# Patient Record
Sex: Female | Born: 1935 | Race: Black or African American | Hispanic: No | Smoking: Former smoker
Health system: Southern US, Community
[De-identification: ages and names within clinical notes are randomized; demographics above are authoritative.]

## PROBLEM LIST (undated history)

## (undated) DIAGNOSIS — M069 Rheumatoid arthritis, unspecified: Secondary | ICD-10-CM

## (undated) DIAGNOSIS — I10 Essential (primary) hypertension: Secondary | ICD-10-CM

## (undated) DIAGNOSIS — I639 Cerebral infarction, unspecified: Secondary | ICD-10-CM

## (undated) DIAGNOSIS — F329 Major depressive disorder, single episode, unspecified: Secondary | ICD-10-CM

## (undated) DIAGNOSIS — I2699 Other pulmonary embolism without acute cor pulmonale: Secondary | ICD-10-CM

## (undated) DIAGNOSIS — E785 Hyperlipidemia, unspecified: Secondary | ICD-10-CM

## (undated) DIAGNOSIS — I38 Endocarditis, valve unspecified: Secondary | ICD-10-CM

## (undated) DIAGNOSIS — R0602 Shortness of breath: Secondary | ICD-10-CM

## (undated) DIAGNOSIS — F32A Depression, unspecified: Secondary | ICD-10-CM

## (undated) DIAGNOSIS — K219 Gastro-esophageal reflux disease without esophagitis: Secondary | ICD-10-CM

## (undated) DIAGNOSIS — R42 Dizziness and giddiness: Secondary | ICD-10-CM

## (undated) HISTORY — DX: Endocarditis, valve unspecified: I38

## (undated) HISTORY — DX: Cerebral infarction, unspecified: I63.9

## (undated) HISTORY — DX: Essential (primary) hypertension: I10

## (undated) HISTORY — PX: WRIST SURGERY: SHX841

## (undated) HISTORY — PX: ABDOMINAL HYSTERECTOMY: SHX81

## (undated) HISTORY — PX: TUBAL LIGATION: SHX77

## (undated) HISTORY — DX: Other pulmonary embolism without acute cor pulmonale: I26.99

## (undated) HISTORY — DX: Rheumatoid arthritis, unspecified: M06.9

## (undated) HISTORY — DX: Hyperlipidemia, unspecified: E78.5

## (undated) HISTORY — PX: TONSILLECTOMY: SUR1361

## (undated) HISTORY — PX: FRACTURE SURGERY: SHX138

## (undated) HISTORY — DX: Gastro-esophageal reflux disease without esophagitis: K21.9

---

## 1969-12-15 HISTORY — PX: VARICOSE VEIN SURGERY: SHX832

## 1984-12-15 HISTORY — PX: ROTATOR CUFF REPAIR: SHX139

## 1996-12-15 HISTORY — PX: RECTOCELE REPAIR: SHX761

## 1998-03-29 ENCOUNTER — Ambulatory Visit (HOSPITAL_BASED_OUTPATIENT_CLINIC_OR_DEPARTMENT_OTHER): Admission: RE | Admit: 1998-03-29 | Discharge: 1998-03-29 | Payer: Self-pay | Admitting: Orthopaedic Surgery

## 1998-04-10 ENCOUNTER — Ambulatory Visit (HOSPITAL_COMMUNITY): Admission: RE | Admit: 1998-04-10 | Discharge: 1998-04-10 | Payer: Self-pay | Admitting: Internal Medicine

## 1998-04-16 ENCOUNTER — Other Ambulatory Visit: Admission: RE | Admit: 1998-04-16 | Discharge: 1998-04-16 | Payer: Self-pay | Admitting: Obstetrics and Gynecology

## 1998-05-01 ENCOUNTER — Ambulatory Visit (HOSPITAL_BASED_OUTPATIENT_CLINIC_OR_DEPARTMENT_OTHER): Admission: RE | Admit: 1998-05-01 | Discharge: 1998-05-01 | Payer: Self-pay | Admitting: Orthopaedic Surgery

## 1999-04-15 ENCOUNTER — Other Ambulatory Visit: Admission: RE | Admit: 1999-04-15 | Discharge: 1999-04-15 | Payer: Self-pay | Admitting: Obstetrics and Gynecology

## 2000-03-30 ENCOUNTER — Encounter: Payer: Self-pay | Admitting: Internal Medicine

## 2000-03-30 ENCOUNTER — Encounter: Admission: RE | Admit: 2000-03-30 | Discharge: 2000-03-30 | Payer: Self-pay | Admitting: Internal Medicine

## 2000-04-26 ENCOUNTER — Emergency Department (HOSPITAL_COMMUNITY): Admission: EM | Admit: 2000-04-26 | Discharge: 2000-04-27 | Payer: Self-pay | Admitting: Emergency Medicine

## 2000-04-26 ENCOUNTER — Encounter: Payer: Self-pay | Admitting: Emergency Medicine

## 2000-06-01 ENCOUNTER — Other Ambulatory Visit: Admission: RE | Admit: 2000-06-01 | Discharge: 2000-06-01 | Payer: Self-pay | Admitting: Obstetrics and Gynecology

## 2001-03-31 ENCOUNTER — Encounter: Payer: Self-pay | Admitting: Internal Medicine

## 2001-03-31 ENCOUNTER — Encounter: Admission: RE | Admit: 2001-03-31 | Discharge: 2001-03-31 | Payer: Self-pay | Admitting: Internal Medicine

## 2001-04-06 ENCOUNTER — Encounter: Admission: RE | Admit: 2001-04-06 | Discharge: 2001-04-06 | Payer: Self-pay | Admitting: Internal Medicine

## 2001-04-06 ENCOUNTER — Encounter: Payer: Self-pay | Admitting: Internal Medicine

## 2001-06-21 ENCOUNTER — Other Ambulatory Visit: Admission: RE | Admit: 2001-06-21 | Discharge: 2001-06-21 | Payer: Self-pay | Admitting: Obstetrics and Gynecology

## 2001-06-24 ENCOUNTER — Encounter: Admission: RE | Admit: 2001-06-24 | Discharge: 2001-06-24 | Payer: Self-pay | Admitting: Rheumatology

## 2001-06-24 ENCOUNTER — Encounter: Payer: Self-pay | Admitting: Rheumatology

## 2002-04-12 ENCOUNTER — Encounter: Admission: RE | Admit: 2002-04-12 | Discharge: 2002-04-12 | Payer: Self-pay | Admitting: Internal Medicine

## 2002-04-12 ENCOUNTER — Encounter: Payer: Self-pay | Admitting: Internal Medicine

## 2002-04-14 HISTORY — PX: PEG TUBE PLACEMENT: SUR1034

## 2002-06-20 ENCOUNTER — Encounter: Admission: RE | Admit: 2002-06-20 | Discharge: 2002-06-20 | Payer: Self-pay | Admitting: Rheumatology

## 2002-06-20 ENCOUNTER — Encounter: Payer: Self-pay | Admitting: Rheumatology

## 2002-07-25 ENCOUNTER — Other Ambulatory Visit: Admission: RE | Admit: 2002-07-25 | Discharge: 2002-07-25 | Payer: Self-pay | Admitting: Obstetrics and Gynecology

## 2003-04-17 ENCOUNTER — Encounter: Payer: Self-pay | Admitting: Internal Medicine

## 2003-04-17 ENCOUNTER — Encounter: Admission: RE | Admit: 2003-04-17 | Discharge: 2003-04-17 | Payer: Self-pay | Admitting: Internal Medicine

## 2003-05-01 ENCOUNTER — Encounter: Payer: Self-pay | Admitting: Rheumatology

## 2003-05-01 ENCOUNTER — Encounter: Admission: RE | Admit: 2003-05-01 | Discharge: 2003-05-01 | Payer: Self-pay | Admitting: Rheumatology

## 2003-09-11 ENCOUNTER — Other Ambulatory Visit: Admission: RE | Admit: 2003-09-11 | Discharge: 2003-09-11 | Payer: Self-pay | Admitting: Obstetrics and Gynecology

## 2004-04-08 ENCOUNTER — Inpatient Hospital Stay (HOSPITAL_COMMUNITY): Admission: EM | Admit: 2004-04-08 | Discharge: 2004-04-23 | Payer: Self-pay | Admitting: Emergency Medicine

## 2004-04-10 ENCOUNTER — Encounter: Payer: Self-pay | Admitting: Cardiovascular Disease

## 2004-04-16 ENCOUNTER — Encounter: Payer: Self-pay | Admitting: Cardiology

## 2004-04-30 ENCOUNTER — Emergency Department (HOSPITAL_COMMUNITY): Admission: EM | Admit: 2004-04-30 | Discharge: 2004-04-30 | Payer: Self-pay | Admitting: *Deleted

## 2004-05-03 ENCOUNTER — Encounter: Payer: Self-pay | Admitting: Cardiology

## 2004-05-03 ENCOUNTER — Ambulatory Visit: Admission: RE | Admit: 2004-05-03 | Discharge: 2004-05-03 | Payer: Self-pay | Admitting: Endocrinology

## 2004-07-04 ENCOUNTER — Emergency Department (HOSPITAL_COMMUNITY): Admission: EM | Admit: 2004-07-04 | Discharge: 2004-07-04 | Payer: Self-pay | Admitting: Emergency Medicine

## 2004-08-12 ENCOUNTER — Ambulatory Visit (HOSPITAL_COMMUNITY): Admission: RE | Admit: 2004-08-12 | Discharge: 2004-08-12 | Payer: Self-pay | Admitting: Internal Medicine

## 2004-08-15 ENCOUNTER — Encounter: Payer: Self-pay | Admitting: Internal Medicine

## 2004-08-15 DIAGNOSIS — D126 Benign neoplasm of colon, unspecified: Secondary | ICD-10-CM

## 2004-09-03 ENCOUNTER — Ambulatory Visit (HOSPITAL_COMMUNITY): Admission: RE | Admit: 2004-09-03 | Discharge: 2004-09-03 | Payer: Self-pay | Admitting: Internal Medicine

## 2004-11-12 ENCOUNTER — Ambulatory Visit: Payer: Self-pay | Admitting: Internal Medicine

## 2004-11-19 ENCOUNTER — Ambulatory Visit: Payer: Self-pay | Admitting: Internal Medicine

## 2004-11-28 ENCOUNTER — Ambulatory Visit: Payer: Self-pay | Admitting: Internal Medicine

## 2004-12-02 ENCOUNTER — Ambulatory Visit: Payer: Self-pay | Admitting: Internal Medicine

## 2005-01-07 ENCOUNTER — Other Ambulatory Visit: Admission: RE | Admit: 2005-01-07 | Discharge: 2005-01-07 | Payer: Self-pay | Admitting: Obstetrics and Gynecology

## 2005-02-12 ENCOUNTER — Ambulatory Visit: Payer: Self-pay | Admitting: Internal Medicine

## 2005-03-24 ENCOUNTER — Ambulatory Visit: Payer: Self-pay | Admitting: Internal Medicine

## 2005-04-10 ENCOUNTER — Ambulatory Visit: Payer: Self-pay | Admitting: Internal Medicine

## 2005-05-07 ENCOUNTER — Ambulatory Visit: Payer: Self-pay | Admitting: Internal Medicine

## 2005-05-17 ENCOUNTER — Emergency Department (HOSPITAL_COMMUNITY): Admission: EM | Admit: 2005-05-17 | Discharge: 2005-05-17 | Payer: Self-pay | Admitting: Emergency Medicine

## 2005-06-30 ENCOUNTER — Ambulatory Visit: Payer: Self-pay | Admitting: Internal Medicine

## 2005-08-13 ENCOUNTER — Ambulatory Visit: Payer: Self-pay | Admitting: Internal Medicine

## 2005-09-18 ENCOUNTER — Ambulatory Visit: Payer: Self-pay | Admitting: Internal Medicine

## 2005-09-30 ENCOUNTER — Ambulatory Visit: Payer: Self-pay | Admitting: Internal Medicine

## 2005-10-24 ENCOUNTER — Ambulatory Visit: Payer: Self-pay | Admitting: Internal Medicine

## 2005-11-12 ENCOUNTER — Ambulatory Visit: Payer: Self-pay | Admitting: Internal Medicine

## 2005-12-01 ENCOUNTER — Ambulatory Visit: Payer: Self-pay | Admitting: Cardiovascular Disease

## 2005-12-11 ENCOUNTER — Ambulatory Visit: Payer: Self-pay

## 2005-12-15 HISTORY — PX: BREAST LUMPECTOMY: SHX2

## 2006-01-20 ENCOUNTER — Ambulatory Visit: Payer: Self-pay | Admitting: Internal Medicine

## 2006-04-21 ENCOUNTER — Ambulatory Visit: Payer: Self-pay | Admitting: Internal Medicine

## 2006-05-25 ENCOUNTER — Ambulatory Visit (HOSPITAL_COMMUNITY): Admission: RE | Admit: 2006-05-25 | Discharge: 2006-05-25 | Payer: Self-pay | Admitting: Obstetrics and Gynecology

## 2006-06-02 ENCOUNTER — Ambulatory Visit: Payer: Self-pay | Admitting: Internal Medicine

## 2006-06-05 ENCOUNTER — Emergency Department (HOSPITAL_COMMUNITY): Admission: EM | Admit: 2006-06-05 | Discharge: 2006-06-05 | Payer: Self-pay | Admitting: Emergency Medicine

## 2006-06-10 ENCOUNTER — Emergency Department (HOSPITAL_COMMUNITY): Admission: EM | Admit: 2006-06-10 | Discharge: 2006-06-10 | Payer: Self-pay | Admitting: Family Medicine

## 2006-06-24 ENCOUNTER — Ambulatory Visit: Payer: Self-pay | Admitting: Internal Medicine

## 2006-07-03 ENCOUNTER — Ambulatory Visit: Payer: Self-pay | Admitting: Internal Medicine

## 2006-07-09 ENCOUNTER — Encounter (INDEPENDENT_AMBULATORY_CARE_PROVIDER_SITE_OTHER): Payer: Self-pay | Admitting: Radiology

## 2006-07-09 ENCOUNTER — Encounter (INDEPENDENT_AMBULATORY_CARE_PROVIDER_SITE_OTHER): Payer: Self-pay | Admitting: *Deleted

## 2006-07-09 ENCOUNTER — Encounter: Admission: RE | Admit: 2006-07-09 | Discharge: 2006-07-09 | Payer: Self-pay | Admitting: Obstetrics and Gynecology

## 2006-07-13 ENCOUNTER — Ambulatory Visit: Payer: Self-pay | Admitting: Internal Medicine

## 2006-07-22 ENCOUNTER — Encounter: Admission: RE | Admit: 2006-07-22 | Discharge: 2006-07-22 | Payer: Self-pay | Admitting: Obstetrics and Gynecology

## 2006-07-30 ENCOUNTER — Ambulatory Visit: Payer: Self-pay | Admitting: Oncology

## 2006-08-03 ENCOUNTER — Ambulatory Visit: Payer: Self-pay | Admitting: Internal Medicine

## 2006-08-05 LAB — CBC WITH DIFFERENTIAL/PLATELET
BASO%: 0.1 % (ref 0.0–2.0)
EOS%: 2.5 % (ref 0.0–7.0)
LYMPH%: 23.4 % (ref 14.0–48.0)
MCH: 33 pg (ref 26.0–34.0)
MCHC: 32.6 g/dL (ref 32.0–36.0)
MCV: 101.1 fL — ABNORMAL HIGH (ref 81.0–101.0)
MONO%: 0.4 % (ref 0.0–13.0)
Platelets: 383 10*3/uL (ref 145–400)
RBC: 4.13 10*6/uL (ref 3.70–5.32)
RDW: 15.6 % — ABNORMAL HIGH (ref 11.3–14.5)

## 2006-08-05 LAB — COMPREHENSIVE METABOLIC PANEL
ALT: 148 U/L — ABNORMAL HIGH (ref 0–40)
AST: 126 U/L — ABNORMAL HIGH (ref 0–37)
Alkaline Phosphatase: 114 U/L (ref 39–117)
Glucose, Bld: 90 mg/dL (ref 70–99)
Sodium: 143 mEq/L (ref 135–145)
Total Bilirubin: 0.7 mg/dL (ref 0.3–1.2)
Total Protein: 6.1 g/dL (ref 6.0–8.3)

## 2006-08-05 LAB — LACTATE DEHYDROGENASE: LDH: 277 U/L — ABNORMAL HIGH (ref 94–250)

## 2006-08-05 LAB — CANCER ANTIGEN 27.29: CA 27.29: 33 U/mL (ref 0–39)

## 2006-08-27 ENCOUNTER — Ambulatory Visit: Admission: RE | Admit: 2006-08-27 | Discharge: 2006-09-06 | Payer: Self-pay | Admitting: *Deleted

## 2006-08-27 ENCOUNTER — Ambulatory Visit (HOSPITAL_COMMUNITY): Admission: RE | Admit: 2006-08-27 | Discharge: 2006-08-27 | Payer: Self-pay | Admitting: *Deleted

## 2006-08-31 LAB — CBC WITH DIFFERENTIAL/PLATELET
BASO%: 0.3 % (ref 0.0–2.0)
EOS%: 0.2 % (ref 0.0–7.0)
HCT: 39.5 % (ref 34.8–46.6)
MCH: 33.4 pg (ref 26.0–34.0)
MCHC: 33 g/dL (ref 32.0–36.0)
MCV: 101.2 fL — ABNORMAL HIGH (ref 81.0–101.0)
MONO%: 0.5 % (ref 0.0–13.0)
NEUT%: 82.5 % — ABNORMAL HIGH (ref 39.6–76.8)
RDW: 18.5 % — ABNORMAL HIGH (ref 11.3–14.5)
lymph#: 2 10*3/uL (ref 0.9–3.3)

## 2006-08-31 LAB — COMPREHENSIVE METABOLIC PANEL
ALT: 25 U/L (ref 0–40)
AST: 22 U/L (ref 0–37)
Alkaline Phosphatase: 153 U/L — ABNORMAL HIGH (ref 39–117)
Calcium: 10.8 mg/dL — ABNORMAL HIGH (ref 8.4–10.5)
Chloride: 102 mEq/L (ref 96–112)
Creatinine, Ser: 0.86 mg/dL (ref 0.40–1.20)

## 2006-08-31 LAB — LACTATE DEHYDROGENASE: LDH: 219 U/L (ref 94–250)

## 2006-09-03 ENCOUNTER — Ambulatory Visit (HOSPITAL_COMMUNITY): Admission: RE | Admit: 2006-09-03 | Discharge: 2006-09-03 | Payer: Self-pay | Admitting: Oncology

## 2006-09-04 ENCOUNTER — Ambulatory Visit (HOSPITAL_COMMUNITY): Admission: RE | Admit: 2006-09-04 | Discharge: 2006-09-04 | Payer: Self-pay | Admitting: Oncology

## 2006-09-07 LAB — CBC WITH DIFFERENTIAL/PLATELET
BASO%: 1.5 % (ref 0.0–2.0)
Basophils Absolute: 0.2 10*3/uL — ABNORMAL HIGH (ref 0.0–0.1)
EOS%: 0.4 % (ref 0.0–7.0)
MCH: 33.7 pg (ref 26.0–34.0)
MCHC: 33.8 g/dL (ref 32.0–36.0)
MCV: 99.7 fL (ref 81.0–101.0)
MONO%: 8.3 % (ref 0.0–13.0)
RBC: 3.77 10*6/uL (ref 3.70–5.32)
RDW: 16 % — ABNORMAL HIGH (ref 11.3–14.5)
lymph#: 2.2 10*3/uL (ref 0.9–3.3)

## 2006-09-08 ENCOUNTER — Emergency Department (HOSPITAL_COMMUNITY): Admission: EM | Admit: 2006-09-08 | Discharge: 2006-09-08 | Payer: Self-pay | Admitting: Emergency Medicine

## 2006-09-21 ENCOUNTER — Ambulatory Visit: Payer: Self-pay | Admitting: Internal Medicine

## 2006-10-26 ENCOUNTER — Ambulatory Visit (HOSPITAL_COMMUNITY): Admission: RE | Admit: 2006-10-26 | Discharge: 2006-10-26 | Payer: Self-pay | Admitting: General Surgery

## 2006-10-26 ENCOUNTER — Encounter (INDEPENDENT_AMBULATORY_CARE_PROVIDER_SITE_OTHER): Payer: Self-pay | Admitting: *Deleted

## 2006-10-28 ENCOUNTER — Ambulatory Visit: Payer: Self-pay | Admitting: Internal Medicine

## 2006-10-29 ENCOUNTER — Ambulatory Visit: Payer: Self-pay | Admitting: Oncology

## 2006-11-11 ENCOUNTER — Ambulatory Visit: Payer: Self-pay | Admitting: Internal Medicine

## 2006-11-13 LAB — CBC WITH DIFFERENTIAL/PLATELET
BASO%: 0.7 % (ref 0.0–2.0)
Basophils Absolute: 0 10*3/uL (ref 0.0–0.1)
EOS%: 1.6 % (ref 0.0–7.0)
HCT: 40.5 % (ref 34.8–46.6)
HGB: 13.5 g/dL (ref 11.6–15.9)
MCH: 31.2 pg (ref 26.0–34.0)
MCHC: 33.4 g/dL (ref 32.0–36.0)
MCV: 93.4 fL (ref 81.0–101.0)
MONO%: 1.3 % (ref 0.0–13.0)
NEUT%: 46.3 % (ref 39.6–76.8)

## 2006-12-21 ENCOUNTER — Encounter (HOSPITAL_COMMUNITY): Admission: RE | Admit: 2006-12-21 | Discharge: 2007-02-24 | Payer: Self-pay | Admitting: Obstetrics and Gynecology

## 2007-01-15 ENCOUNTER — Ambulatory Visit: Payer: Self-pay | Admitting: Internal Medicine

## 2007-02-08 ENCOUNTER — Ambulatory Visit: Payer: Self-pay | Admitting: Oncology

## 2007-02-11 LAB — CBC WITH DIFFERENTIAL/PLATELET
Eosinophils Absolute: 0 10*3/uL (ref 0.0–0.5)
HGB: 12 g/dL (ref 11.6–15.9)
MCV: 91.5 fL (ref 81.0–101.0)
MONO%: 1.2 % (ref 0.0–13.0)
NEUT#: 2.8 10*3/uL (ref 1.5–6.5)
RBC: 3.86 10*6/uL (ref 3.70–5.32)
RDW: 16.6 % — ABNORMAL HIGH (ref 11.3–14.5)
WBC: 4.8 10*3/uL (ref 3.9–10.0)
lymph#: 1.9 10*3/uL (ref 0.9–3.3)

## 2007-03-29 ENCOUNTER — Ambulatory Visit (HOSPITAL_COMMUNITY): Admission: RE | Admit: 2007-03-29 | Discharge: 2007-03-29 | Payer: Self-pay | Admitting: Obstetrics and Gynecology

## 2007-04-08 ENCOUNTER — Ambulatory Visit: Payer: Self-pay | Admitting: Internal Medicine

## 2007-04-08 LAB — CONVERTED CEMR LAB
ALT: 43 units/L — ABNORMAL HIGH (ref 0–40)
AST: 33 units/L (ref 0–37)
BUN: 16 mg/dL (ref 6–23)
Basophils Absolute: 0 10*3/uL (ref 0.0–0.1)
Basophils Relative: 0.3 % (ref 0.0–1.0)
CO2: 29 meq/L (ref 19–32)
Calcium: 9.9 mg/dL (ref 8.4–10.5)
Chloride: 101 meq/L (ref 96–112)
Creatinine, Ser: 1.2 mg/dL (ref 0.4–1.2)
Eosinophils Absolute: 0 10*3/uL (ref 0.0–0.6)
Eosinophils Relative: 0.5 % (ref 0.0–5.0)
GFR calc Af Amer: 57 mL/min
GFR calc non Af Amer: 47 mL/min
Glucose, Bld: 97 mg/dL (ref 70–99)
HCT: 39.8 % (ref 36.0–46.0)
Hemoglobin: 13.5 g/dL (ref 12.0–15.0)
Lymphocytes Relative: 40.8 % (ref 12.0–46.0)
MCHC: 34.1 g/dL (ref 30.0–36.0)
MCV: 96 fL (ref 78.0–100.0)
Monocytes Absolute: 0.4 10*3/uL (ref 0.2–0.7)
Monocytes Relative: 7.5 % (ref 3.0–11.0)
Neutro Abs: 3.1 10*3/uL (ref 1.4–7.7)
Neutrophils Relative %: 50.9 % (ref 43.0–77.0)
Platelets: 541 10*3/uL — ABNORMAL HIGH (ref 150–400)
Potassium: 3.5 meq/L (ref 3.5–5.1)
RBC: 4.14 M/uL (ref 3.87–5.11)
RDW: 16.2 % — ABNORMAL HIGH (ref 11.5–14.6)
Sodium: 140 meq/L (ref 135–145)
TSH: 1.55 microintl units/mL (ref 0.35–5.50)
WBC: 5.9 10*3/uL (ref 4.5–10.5)

## 2007-05-04 ENCOUNTER — Ambulatory Visit: Payer: Self-pay | Admitting: Oncology

## 2007-05-06 LAB — CBC WITH DIFFERENTIAL/PLATELET
Basophils Absolute: 0 10*3/uL (ref 0.0–0.1)
Eosinophils Absolute: 0 10*3/uL (ref 0.0–0.5)
HGB: 13.7 g/dL (ref 11.6–15.9)
LYMPH%: 18.5 % (ref 14.0–48.0)
MONO#: 0.2 10*3/uL (ref 0.1–0.9)
NEUT#: 6 10*3/uL (ref 1.5–6.5)
Platelets: 329 10*3/uL (ref 145–400)
RBC: 4.16 10*6/uL (ref 3.70–5.32)
RDW: 17 % — ABNORMAL HIGH (ref 11.3–14.5)
WBC: 7.6 10*3/uL (ref 3.9–10.0)

## 2007-05-06 LAB — COMPREHENSIVE METABOLIC PANEL
Albumin: 4.6 g/dL (ref 3.5–5.2)
CO2: 27 mEq/L (ref 19–32)
Glucose, Bld: 65 mg/dL — ABNORMAL LOW (ref 70–99)
Potassium: 4.2 mEq/L (ref 3.5–5.3)
Sodium: 143 mEq/L (ref 135–145)
Total Protein: 6.6 g/dL (ref 6.0–8.3)

## 2007-05-06 LAB — CANCER ANTIGEN 27.29: CA 27.29: 21 U/mL (ref 0–39)

## 2007-06-07 ENCOUNTER — Emergency Department (HOSPITAL_COMMUNITY): Admission: EM | Admit: 2007-06-07 | Discharge: 2007-06-07 | Payer: Self-pay | Admitting: Emergency Medicine

## 2007-06-09 ENCOUNTER — Other Ambulatory Visit: Payer: Self-pay | Admitting: Oncology

## 2007-06-09 LAB — CBC WITH DIFFERENTIAL/PLATELET
Basophils Absolute: 0.3 10*3/uL — ABNORMAL HIGH (ref 0.0–0.1)
EOS%: 0.4 % (ref 0.0–7.0)
HCT: 40.8 % (ref 34.8–46.6)
HGB: 13.9 g/dL (ref 11.6–15.9)
LYMPH%: 27.1 % (ref 14.0–48.0)
MCH: 33.1 pg (ref 26.0–34.0)
NEUT%: 57.5 % (ref 39.6–76.8)
Platelets: 482 10*3/uL — ABNORMAL HIGH (ref 145–400)
lymph#: 1.9 10*3/uL (ref 0.9–3.3)

## 2007-06-09 LAB — COMPREHENSIVE METABOLIC PANEL
AST: 30 U/L (ref 0–37)
BUN: 10 mg/dL (ref 6–23)
CO2: 30 mEq/L (ref 19–32)
Calcium: 10.2 mg/dL (ref 8.4–10.5)
Chloride: 101 mEq/L (ref 96–112)
Creatinine, Ser: 1.08 mg/dL (ref 0.40–1.20)

## 2007-07-14 ENCOUNTER — Encounter: Admission: RE | Admit: 2007-07-14 | Discharge: 2007-07-14 | Payer: Self-pay | Admitting: Oncology

## 2007-07-26 ENCOUNTER — Encounter: Payer: Self-pay | Admitting: Internal Medicine

## 2007-07-30 ENCOUNTER — Ambulatory Visit: Payer: Self-pay | Admitting: Oncology

## 2007-08-04 ENCOUNTER — Encounter: Payer: Self-pay | Admitting: Internal Medicine

## 2007-08-04 LAB — CBC WITH DIFFERENTIAL/PLATELET
Basophils Absolute: 0 10*3/uL (ref 0.0–0.1)
HCT: 37.1 % (ref 34.8–46.6)
HGB: 12.4 g/dL (ref 11.6–15.9)
MONO#: 0.3 10*3/uL (ref 0.1–0.9)
NEUT%: 78 % — ABNORMAL HIGH (ref 39.6–76.8)
WBC: 9.1 10*3/uL (ref 3.9–10.0)
lymph#: 1.7 10*3/uL (ref 0.9–3.3)

## 2007-08-09 ENCOUNTER — Ambulatory Visit: Payer: Self-pay | Admitting: Internal Medicine

## 2007-08-09 DIAGNOSIS — K219 Gastro-esophageal reflux disease without esophagitis: Secondary | ICD-10-CM

## 2007-08-09 DIAGNOSIS — M069 Rheumatoid arthritis, unspecified: Secondary | ICD-10-CM | POA: Insufficient documentation

## 2007-08-09 DIAGNOSIS — I10 Essential (primary) hypertension: Secondary | ICD-10-CM | POA: Insufficient documentation

## 2007-08-09 DIAGNOSIS — Z86718 Personal history of other venous thrombosis and embolism: Secondary | ICD-10-CM

## 2007-08-09 DIAGNOSIS — Z853 Personal history of malignant neoplasm of breast: Secondary | ICD-10-CM

## 2007-08-09 HISTORY — DX: Essential (primary) hypertension: I10

## 2007-08-11 LAB — CONVERTED CEMR LAB
ALT: 68 units/L — ABNORMAL HIGH (ref 0–35)
AST: 37 units/L (ref 0–37)
Albumin: 3.7 g/dL (ref 3.5–5.2)
Alkaline Phosphatase: 103 units/L (ref 39–117)
BUN: 21 mg/dL (ref 6–23)
Basophils Absolute: 0 10*3/uL (ref 0.0–0.1)
Basophils Relative: 0.2 % (ref 0.0–1.0)
Bilirubin, Direct: 0.1 mg/dL (ref 0.0–0.3)
CO2: 33 meq/L — ABNORMAL HIGH (ref 19–32)
Calcium: 9.7 mg/dL (ref 8.4–10.5)
Chloride: 104 meq/L (ref 96–112)
Cholesterol: 252 mg/dL (ref 0–200)
Creatinine, Ser: 1.1 mg/dL (ref 0.4–1.2)
Direct LDL: 108.3 mg/dL
Eosinophils Absolute: 0.1 10*3/uL (ref 0.0–0.6)
Eosinophils Relative: 1.7 % (ref 0.0–5.0)
GFR calc Af Amer: 62 mL/min
GFR calc non Af Amer: 52 mL/min
Glucose, Bld: 73 mg/dL (ref 70–99)
HCT: 35.8 % — ABNORMAL LOW (ref 36.0–46.0)
HDL: 123.1 mg/dL (ref >39.0)
Hemoglobin: 11.8 g/dL — ABNORMAL LOW (ref 12.0–15.0)
Lymphocytes Relative: 24.9 % (ref 12.0–46.0)
MCHC: 33 g/dL (ref 30.0–36.0)
MCV: 100.7 fL — ABNORMAL HIGH (ref 78.0–100.0)
Monocytes Absolute: 0.3 10*3/uL (ref 0.2–0.7)
Monocytes Relative: 3.6 % (ref 3.0–11.0)
Neutro Abs: 5.4 10*3/uL (ref 1.4–7.7)
Neutrophils Relative %: 69.6 % (ref 43.0–77.0)
Platelets: 325 10*3/uL (ref 150–400)
Potassium: 3.5 meq/L (ref 3.5–5.1)
RBC: 3.56 M/uL — ABNORMAL LOW (ref 3.87–5.11)
RDW: 17.6 % — ABNORMAL HIGH (ref 11.5–14.6)
Sodium: 145 meq/L (ref 135–145)
TSH: 3.08 microintl units/mL (ref 0.35–5.50)
Total Bilirubin: 0.6 mg/dL (ref 0.3–1.2)
Total CHOL/HDL Ratio: 2
Total Protein: 5.5 g/dL — ABNORMAL LOW (ref 6.0–8.3)
Triglycerides: 95 mg/dL (ref 0–149)
VLDL: 19 mg/dL (ref 0–40)
WBC: 7.7 10*3/uL (ref 4.5–10.5)

## 2007-08-17 ENCOUNTER — Ambulatory Visit: Payer: Self-pay

## 2007-08-17 ENCOUNTER — Encounter: Payer: Self-pay | Admitting: Internal Medicine

## 2007-08-25 ENCOUNTER — Telehealth: Payer: Self-pay | Admitting: Internal Medicine

## 2007-08-26 ENCOUNTER — Ambulatory Visit: Payer: Self-pay | Admitting: Internal Medicine

## 2007-08-30 ENCOUNTER — Ambulatory Visit: Payer: Self-pay | Admitting: Internal Medicine

## 2007-08-30 LAB — CONVERTED CEMR LAB
ALT: 36 units/L — ABNORMAL HIGH (ref 0–35)
AST: 30 units/L (ref 0–37)
Albumin: 3.9 g/dL (ref 3.5–5.2)
Alkaline Phosphatase: 109 units/L (ref 39–117)
BUN: 18 mg/dL (ref 6–23)
Basophils Absolute: 0.1 10*3/uL (ref 0.0–0.1)
Basophils Relative: 1.1 % — ABNORMAL HIGH (ref 0.0–1.0)
Bilirubin, Direct: 0.2 mg/dL (ref 0.0–0.3)
CO2: 31 meq/L (ref 19–32)
Calcium: 9.7 mg/dL (ref 8.4–10.5)
Chloride: 107 meq/L (ref 96–112)
Creatinine, Ser: 1.1 mg/dL (ref 0.4–1.2)
Eosinophils Absolute: 0.1 10*3/uL (ref 0.0–0.6)
Eosinophils Relative: 1.7 % (ref 0.0–5.0)
GFR calc Af Amer: 62 mL/min
GFR calc non Af Amer: 52 mL/min
Glucose, Bld: 57 mg/dL — ABNORMAL LOW (ref 70–99)
HCT: 39.2 % (ref 36.0–46.0)
Hemoglobin: 12.9 g/dL (ref 12.0–15.0)
Lymphocytes Relative: 33.1 % (ref 12.0–46.0)
MCHC: 32.9 g/dL (ref 30.0–36.0)
MCV: 99.2 fL (ref 78.0–100.0)
Monocytes Absolute: 0.4 10*3/uL (ref 0.2–0.7)
Monocytes Relative: 5.9 % (ref 3.0–11.0)
Neutro Abs: 4.1 10*3/uL (ref 1.4–7.7)
Neutrophils Relative %: 58.2 % (ref 43.0–77.0)
Platelets: 443 10*3/uL — ABNORMAL HIGH (ref 150–400)
Potassium: 3.9 meq/L (ref 3.5–5.1)
RBC: 3.95 M/uL (ref 3.87–5.11)
RDW: 15.9 % — ABNORMAL HIGH (ref 11.5–14.6)
Sodium: 146 meq/L — ABNORMAL HIGH (ref 135–145)
Total Bilirubin: 0.6 mg/dL (ref 0.3–1.2)
Total Protein: 5.8 g/dL — ABNORMAL LOW (ref 6.0–8.3)
WBC: 7 10*3/uL (ref 4.5–10.5)

## 2007-09-28 ENCOUNTER — Ambulatory Visit: Payer: Self-pay | Admitting: Internal Medicine

## 2007-10-25 ENCOUNTER — Ambulatory Visit: Payer: Self-pay | Admitting: Oncology

## 2007-10-27 ENCOUNTER — Encounter: Payer: Self-pay | Admitting: Internal Medicine

## 2007-10-27 LAB — CBC WITH DIFFERENTIAL/PLATELET
Basophils Absolute: 0 10*3/uL (ref 0.0–0.1)
EOS%: 1.1 % (ref 0.0–7.0)
HCT: 39.5 % (ref 34.8–46.6)
HGB: 13.5 g/dL (ref 11.6–15.9)
MCH: 33.1 pg (ref 26.0–34.0)
MCV: 97 fL (ref 81.0–101.0)
MONO%: 0.2 % (ref 0.0–13.0)
NEUT%: 73.3 % (ref 39.6–76.8)

## 2007-11-03 ENCOUNTER — Telehealth: Payer: Self-pay | Admitting: *Deleted

## 2007-11-29 ENCOUNTER — Ambulatory Visit: Payer: Self-pay | Admitting: Internal Medicine

## 2007-12-08 ENCOUNTER — Ambulatory Visit: Payer: Self-pay | Admitting: Internal Medicine

## 2007-12-20 ENCOUNTER — Encounter: Admission: RE | Admit: 2007-12-20 | Discharge: 2007-12-20 | Payer: Self-pay | Admitting: Internal Medicine

## 2007-12-20 ENCOUNTER — Telehealth: Payer: Self-pay | Admitting: Internal Medicine

## 2007-12-31 ENCOUNTER — Encounter: Payer: Self-pay | Admitting: Family Medicine

## 2008-01-03 ENCOUNTER — Telehealth: Payer: Self-pay | Admitting: Internal Medicine

## 2008-01-06 ENCOUNTER — Telehealth: Payer: Self-pay | Admitting: Internal Medicine

## 2008-01-11 ENCOUNTER — Ambulatory Visit: Payer: Self-pay | Admitting: Internal Medicine

## 2008-01-12 ENCOUNTER — Encounter: Payer: Self-pay | Admitting: Internal Medicine

## 2008-01-12 LAB — CONVERTED CEMR LAB
CO2: 30 meq/L (ref 19–32)
Creatinine, Ser: 1 mg/dL (ref 0.4–1.2)
Eosinophils Absolute: 0.1 10*3/uL (ref 0.0–0.7)
Lymphocytes Relative: 39 % (ref 12–46)
Lymphs Abs: 1 10*3/uL (ref 0.7–4.0)
Neutrophils Relative %: 32 % — ABNORMAL LOW (ref 43–77)
Platelets: 276 10*3/uL (ref 150–400)
Potassium: 3.2 meq/L — ABNORMAL LOW (ref 3.5–5.1)
Sodium: 140 meq/L (ref 135–145)
TSH: 1.42 microintl units/mL (ref 0.35–5.50)
Total Bilirubin: 0.6 mg/dL (ref 0.3–1.2)
Total Protein: 5.4 g/dL — ABNORMAL LOW (ref 6.0–8.3)
WBC: 2.5 10*3/uL — ABNORMAL LOW (ref 4.0–10.5)

## 2008-01-13 ENCOUNTER — Ambulatory Visit: Payer: Self-pay | Admitting: Oncology

## 2008-01-14 ENCOUNTER — Encounter: Payer: Self-pay | Admitting: Internal Medicine

## 2008-01-17 ENCOUNTER — Encounter: Payer: Self-pay | Admitting: Internal Medicine

## 2008-01-17 LAB — CBC WITH DIFFERENTIAL/PLATELET
BASO%: 1.4 % (ref 0.0–2.0)
HCT: 35.2 % (ref 34.8–46.6)
LYMPH%: 20.8 % (ref 14.0–48.0)
MCHC: 34.3 g/dL (ref 32.0–36.0)
MCV: 92.3 fL (ref 81.0–101.0)
MONO#: 1.9 10*3/uL — ABNORMAL HIGH (ref 0.1–0.9)
MONO%: 11.8 % (ref 0.0–13.0)
NEUT%: 65.2 % (ref 39.6–76.8)
Platelets: 1126 10*3/uL — ABNORMAL HIGH (ref 145–400)
WBC: 16.4 10*3/uL — ABNORMAL HIGH (ref 3.9–10.0)

## 2008-01-17 LAB — COMPREHENSIVE METABOLIC PANEL WITH GFR
ALT: 21 U/L (ref 0–35)
AST: 27 U/L (ref 0–37)
Albumin: 2.6 g/dL — ABNORMAL LOW (ref 3.5–5.2)
Alkaline Phosphatase: 105 U/L (ref 39–117)
BUN: 17 mg/dL (ref 6–23)
CO2: 28 meq/L (ref 19–32)
Calcium: 10.1 mg/dL (ref 8.4–10.5)
Chloride: 103 meq/L (ref 96–112)
Creatinine, Ser: 0.98 mg/dL (ref 0.40–1.20)
Glucose, Bld: 86 mg/dL (ref 70–99)
Potassium: 4.2 meq/L (ref 3.5–5.3)
Sodium: 139 meq/L (ref 135–145)
Total Bilirubin: 0.5 mg/dL (ref 0.3–1.2)
Total Protein: 4.8 g/dL — ABNORMAL LOW (ref 6.0–8.3)

## 2008-01-17 LAB — CANCER ANTIGEN 27.29: CA 27.29: 31 U/mL (ref 0–39)

## 2008-01-19 ENCOUNTER — Encounter: Payer: Self-pay | Admitting: Internal Medicine

## 2008-01-27 ENCOUNTER — Ambulatory Visit: Payer: Self-pay | Admitting: Internal Medicine

## 2008-01-31 LAB — CONVERTED CEMR LAB
Basophils Relative: 0.9 % (ref 0.0–1.0)
CO2: 30 meq/L (ref 19–32)
Chloride: 102 meq/L (ref 96–112)
Creatinine, Ser: 0.9 mg/dL (ref 0.4–1.2)
Eosinophils Absolute: 0.1 10*3/uL (ref 0.0–0.6)
Eosinophils Relative: 1.5 % (ref 0.0–5.0)
GFR calc non Af Amer: 65 mL/min
Glucose, Bld: 85 mg/dL (ref 70–99)
HCT: 42.8 % (ref 36.0–46.0)
Lymphocytes Relative: 26.2 % (ref 12.0–46.0)
MCV: 100.4 fL — ABNORMAL HIGH (ref 78.0–100.0)
Neutrophils Relative %: 66.7 % (ref 43.0–77.0)
RBC: 4.26 M/uL (ref 3.87–5.11)
Sodium: 142 meq/L (ref 135–145)
WBC: 8.1 10*3/uL (ref 4.5–10.5)

## 2008-02-17 ENCOUNTER — Ambulatory Visit: Payer: Self-pay | Admitting: Internal Medicine

## 2008-02-29 ENCOUNTER — Encounter: Payer: Self-pay | Admitting: Internal Medicine

## 2008-02-29 ENCOUNTER — Ambulatory Visit: Payer: Self-pay | Admitting: Oncology

## 2008-03-06 DIAGNOSIS — F329 Major depressive disorder, single episode, unspecified: Secondary | ICD-10-CM

## 2008-03-06 DIAGNOSIS — I2510 Atherosclerotic heart disease of native coronary artery without angina pectoris: Secondary | ICD-10-CM | POA: Insufficient documentation

## 2008-03-06 DIAGNOSIS — I509 Heart failure, unspecified: Secondary | ICD-10-CM | POA: Insufficient documentation

## 2008-03-06 DIAGNOSIS — Z8639 Personal history of other endocrine, nutritional and metabolic disease: Secondary | ICD-10-CM | POA: Insufficient documentation

## 2008-03-06 DIAGNOSIS — G4730 Sleep apnea, unspecified: Secondary | ICD-10-CM | POA: Insufficient documentation

## 2008-03-06 DIAGNOSIS — Z862 Personal history of diseases of the blood and blood-forming organs and certain disorders involving the immune mechanism: Secondary | ICD-10-CM | POA: Insufficient documentation

## 2008-03-06 DIAGNOSIS — I38 Endocarditis, valve unspecified: Secondary | ICD-10-CM | POA: Insufficient documentation

## 2008-03-06 DIAGNOSIS — F411 Generalized anxiety disorder: Secondary | ICD-10-CM | POA: Insufficient documentation

## 2008-03-06 DIAGNOSIS — I6350 Cerebral infarction due to unspecified occlusion or stenosis of unspecified cerebral artery: Secondary | ICD-10-CM | POA: Insufficient documentation

## 2008-03-06 DIAGNOSIS — F3289 Other specified depressive episodes: Secondary | ICD-10-CM | POA: Insufficient documentation

## 2008-03-06 DIAGNOSIS — Z8669 Personal history of other diseases of the nervous system and sense organs: Secondary | ICD-10-CM

## 2008-03-22 ENCOUNTER — Ambulatory Visit: Payer: Self-pay | Admitting: Internal Medicine

## 2008-04-04 LAB — CBC WITH DIFFERENTIAL/PLATELET
Basophils Absolute: 0 10*3/uL (ref 0.0–0.1)
Eosinophils Absolute: 0 10*3/uL (ref 0.0–0.5)
HCT: 39.1 % (ref 34.8–46.6)
HGB: 13.6 g/dL (ref 11.6–15.9)
LYMPH%: 42.4 % (ref 14.0–48.0)
MCV: 103.8 fL — ABNORMAL HIGH (ref 81.0–101.0)
MONO#: 0.2 10*3/uL (ref 0.1–0.9)
NEUT#: 5.4 10*3/uL (ref 1.5–6.5)
NEUT%: 55 % (ref 39.6–76.8)
Platelets: 434 10*3/uL — ABNORMAL HIGH (ref 145–400)
RBC: 3.77 10*6/uL (ref 3.70–5.32)
WBC: 9.8 10*3/uL (ref 3.9–10.0)

## 2008-04-19 ENCOUNTER — Encounter: Payer: Self-pay | Admitting: Internal Medicine

## 2008-05-23 ENCOUNTER — Telehealth: Payer: Self-pay | Admitting: Internal Medicine

## 2008-05-25 ENCOUNTER — Ambulatory Visit: Payer: Self-pay | Admitting: Internal Medicine

## 2008-05-26 LAB — CONVERTED CEMR LAB
Basophils Absolute: 0.1 10*3/uL (ref 0.0–0.1)
Bilirubin, Direct: 0.1 mg/dL (ref 0.0–0.3)
Eosinophils Absolute: 0 10*3/uL (ref 0.0–0.7)
HCT: 40.6 % (ref 36.0–46.0)
MCHC: 33.5 g/dL (ref 30.0–36.0)
MCV: 101.2 fL — ABNORMAL HIGH (ref 78.0–100.0)
Monocytes Absolute: 0.7 10*3/uL (ref 0.1–1.0)
Neutro Abs: 3 10*3/uL (ref 1.4–7.7)
Platelets: 418 10*3/uL — ABNORMAL HIGH (ref 150–400)
RDW: 13.6 % (ref 11.5–14.6)
Total Bilirubin: 0.6 mg/dL (ref 0.3–1.2)

## 2008-07-09 ENCOUNTER — Ambulatory Visit: Payer: Self-pay | Admitting: Oncology

## 2008-07-17 ENCOUNTER — Encounter: Admission: RE | Admit: 2008-07-17 | Discharge: 2008-07-17 | Payer: Self-pay | Admitting: Oncology

## 2008-07-18 ENCOUNTER — Telehealth: Payer: Self-pay | Admitting: Internal Medicine

## 2008-07-19 ENCOUNTER — Ambulatory Visit: Payer: Self-pay | Admitting: Internal Medicine

## 2008-07-21 ENCOUNTER — Telehealth: Payer: Self-pay | Admitting: Internal Medicine

## 2008-07-24 ENCOUNTER — Encounter: Payer: Self-pay | Admitting: Internal Medicine

## 2008-07-24 LAB — CBC WITH DIFFERENTIAL/PLATELET
BASO%: 0.3 % (ref 0.0–2.0)
LYMPH%: 32.7 % (ref 14.0–48.0)
MCHC: 33.7 g/dL (ref 32.0–36.0)
MONO#: 0.5 10*3/uL (ref 0.1–0.9)
Platelets: 363 10*3/uL (ref 145–400)
RBC: 4.44 10*6/uL (ref 3.70–5.32)
WBC: 8.7 10*3/uL (ref 3.9–10.0)
lymph#: 2.9 10*3/uL (ref 0.9–3.3)

## 2008-07-24 LAB — COMPREHENSIVE METABOLIC PANEL
AST: 27 U/L (ref 0–37)
Alkaline Phosphatase: 123 U/L — ABNORMAL HIGH (ref 39–117)
BUN: 13 mg/dL (ref 6–23)
Creatinine, Ser: 1.06 mg/dL (ref 0.40–1.20)
Glucose, Bld: 113 mg/dL — ABNORMAL HIGH (ref 70–99)
Total Bilirubin: 0.6 mg/dL (ref 0.3–1.2)

## 2008-07-25 ENCOUNTER — Encounter: Payer: Self-pay | Admitting: Internal Medicine

## 2008-08-03 IMAGING — CR DG LUMBAR SPINE 2-3V
3 series · 3 of 3 positions shown · non-contrast
Comparison: none

CLINICAL DATA: Back pain.
 LUMBAR SPINE ? 2 VIEW:

[view not recorded (1 of 3)]
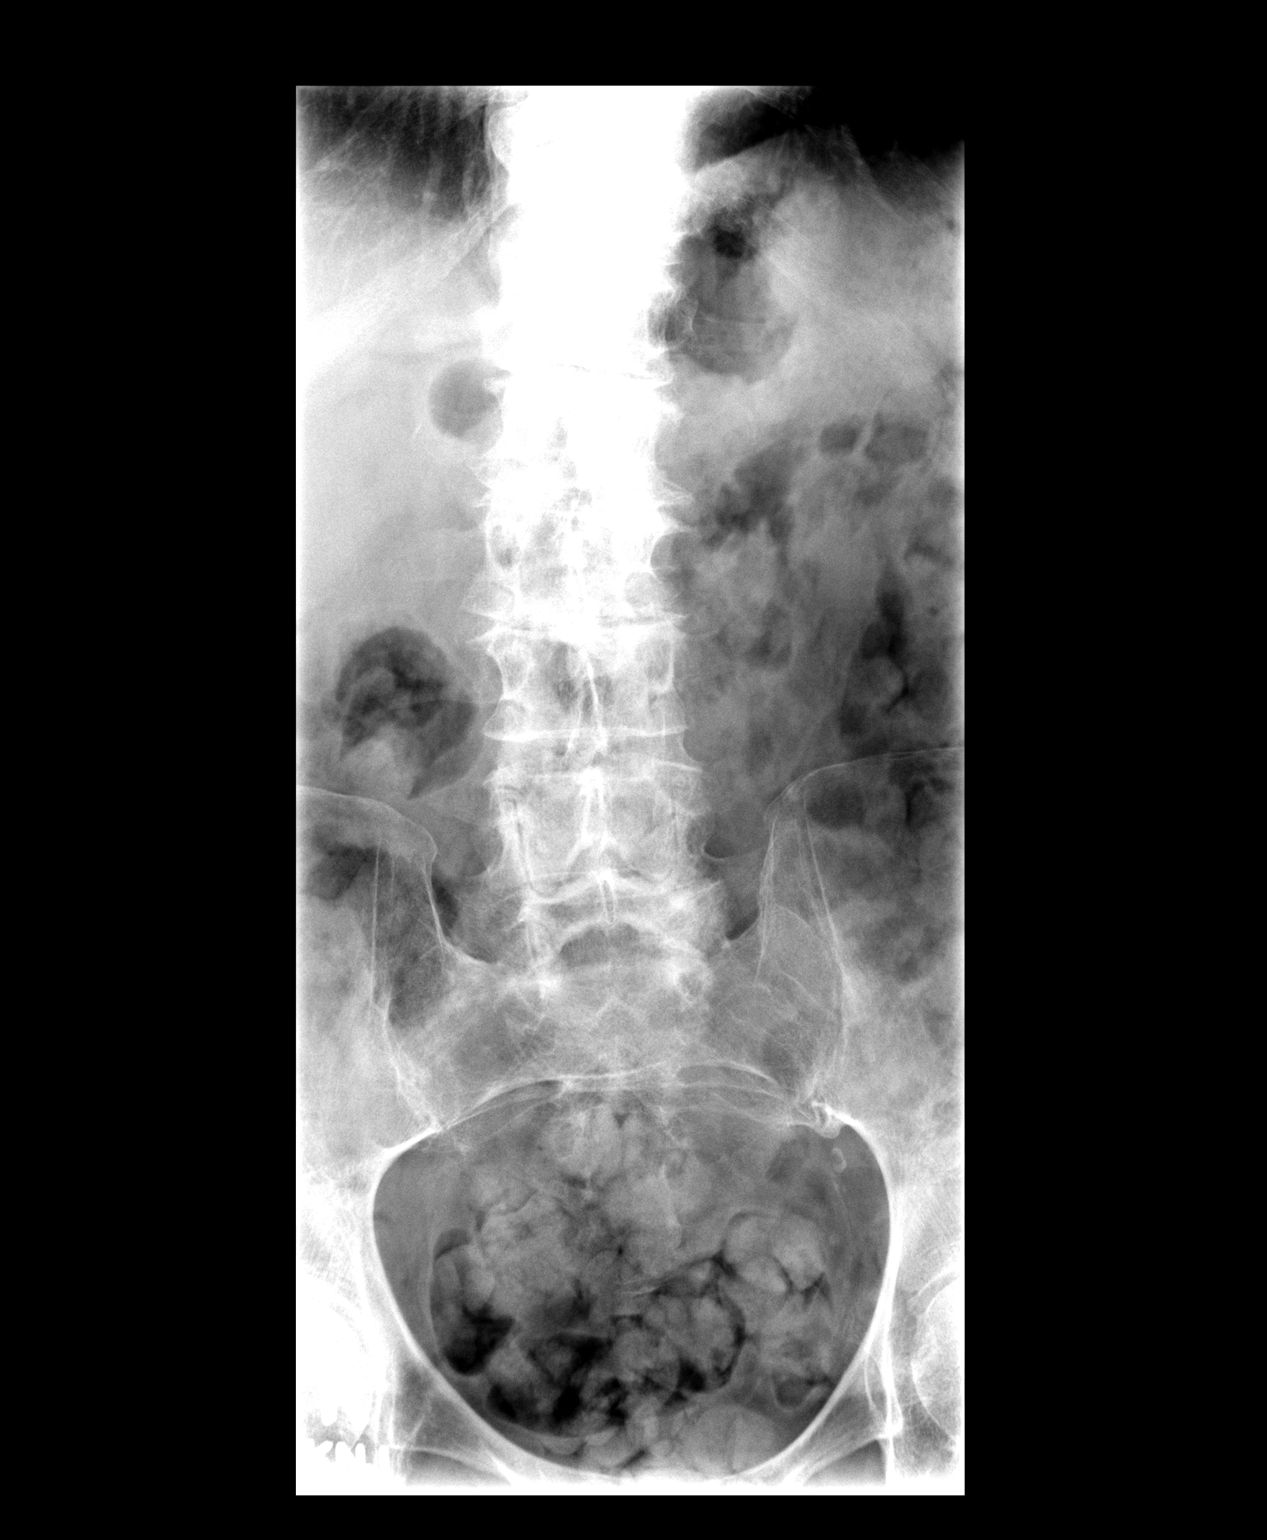

[view not recorded (2 of 3)]
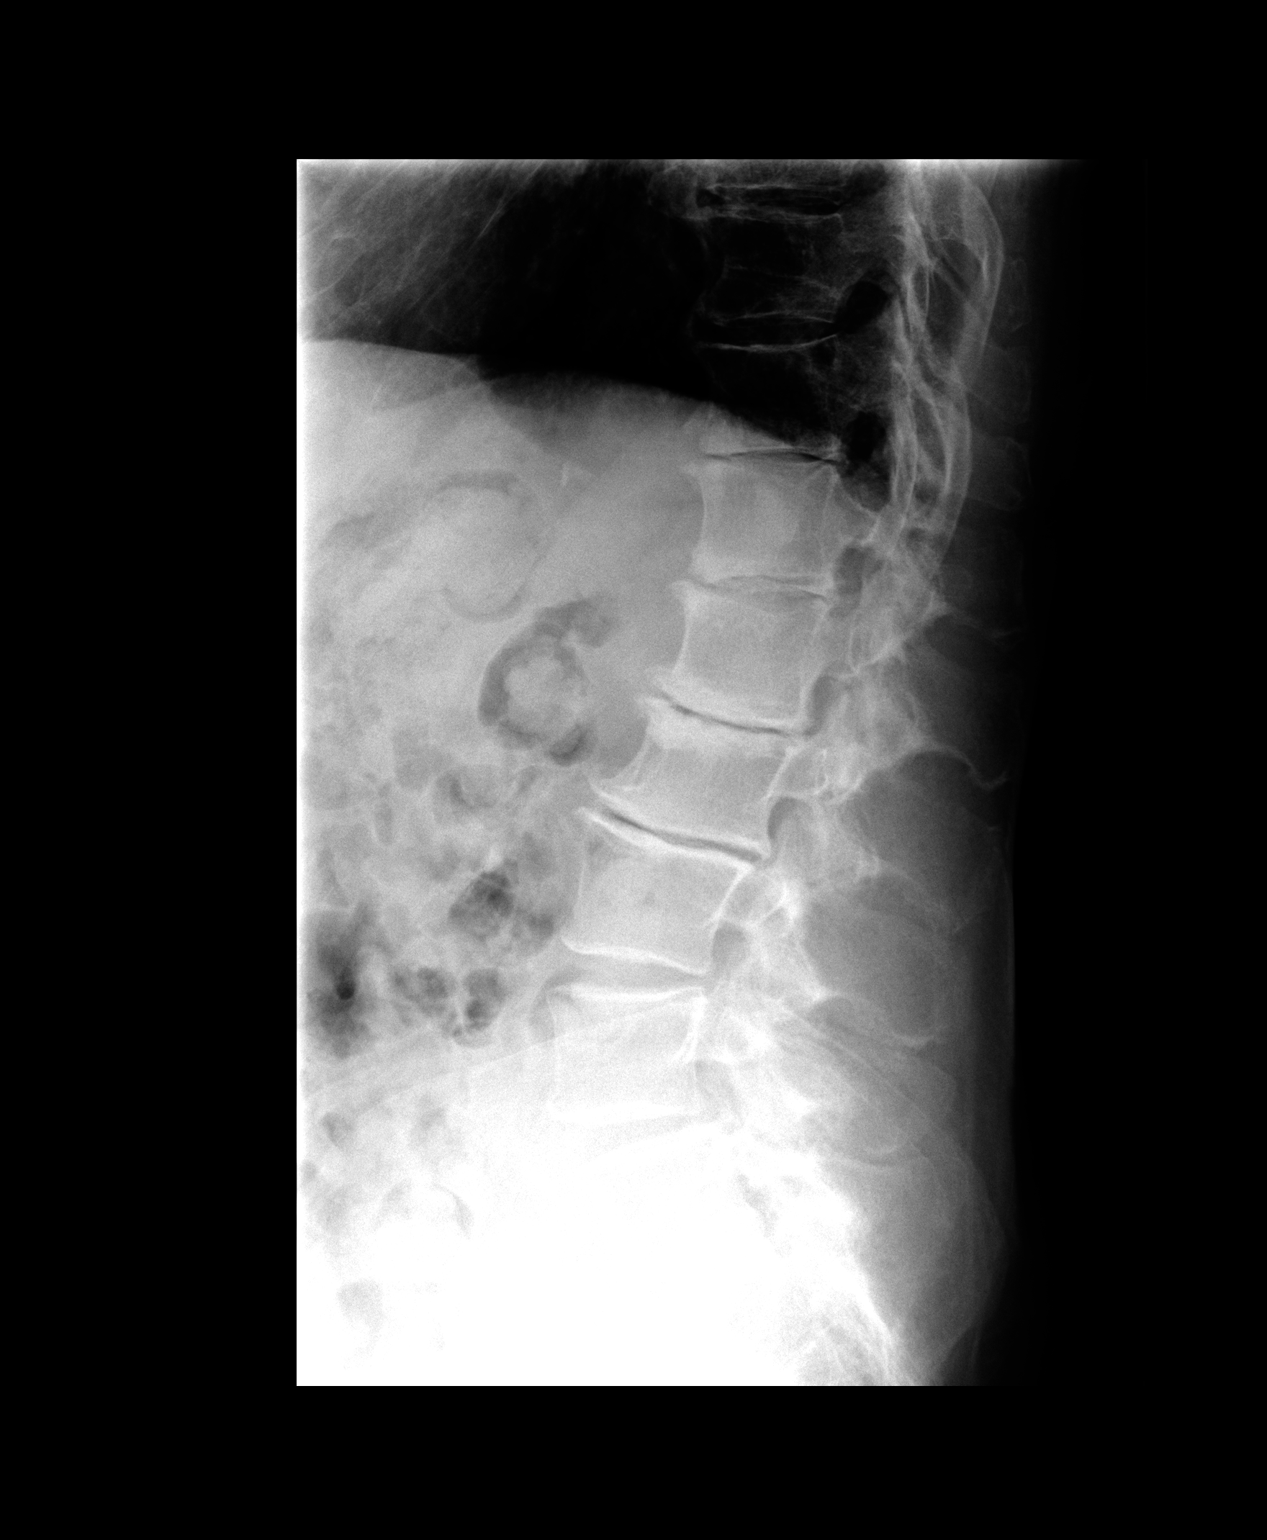

[view not recorded (3 of 3)]
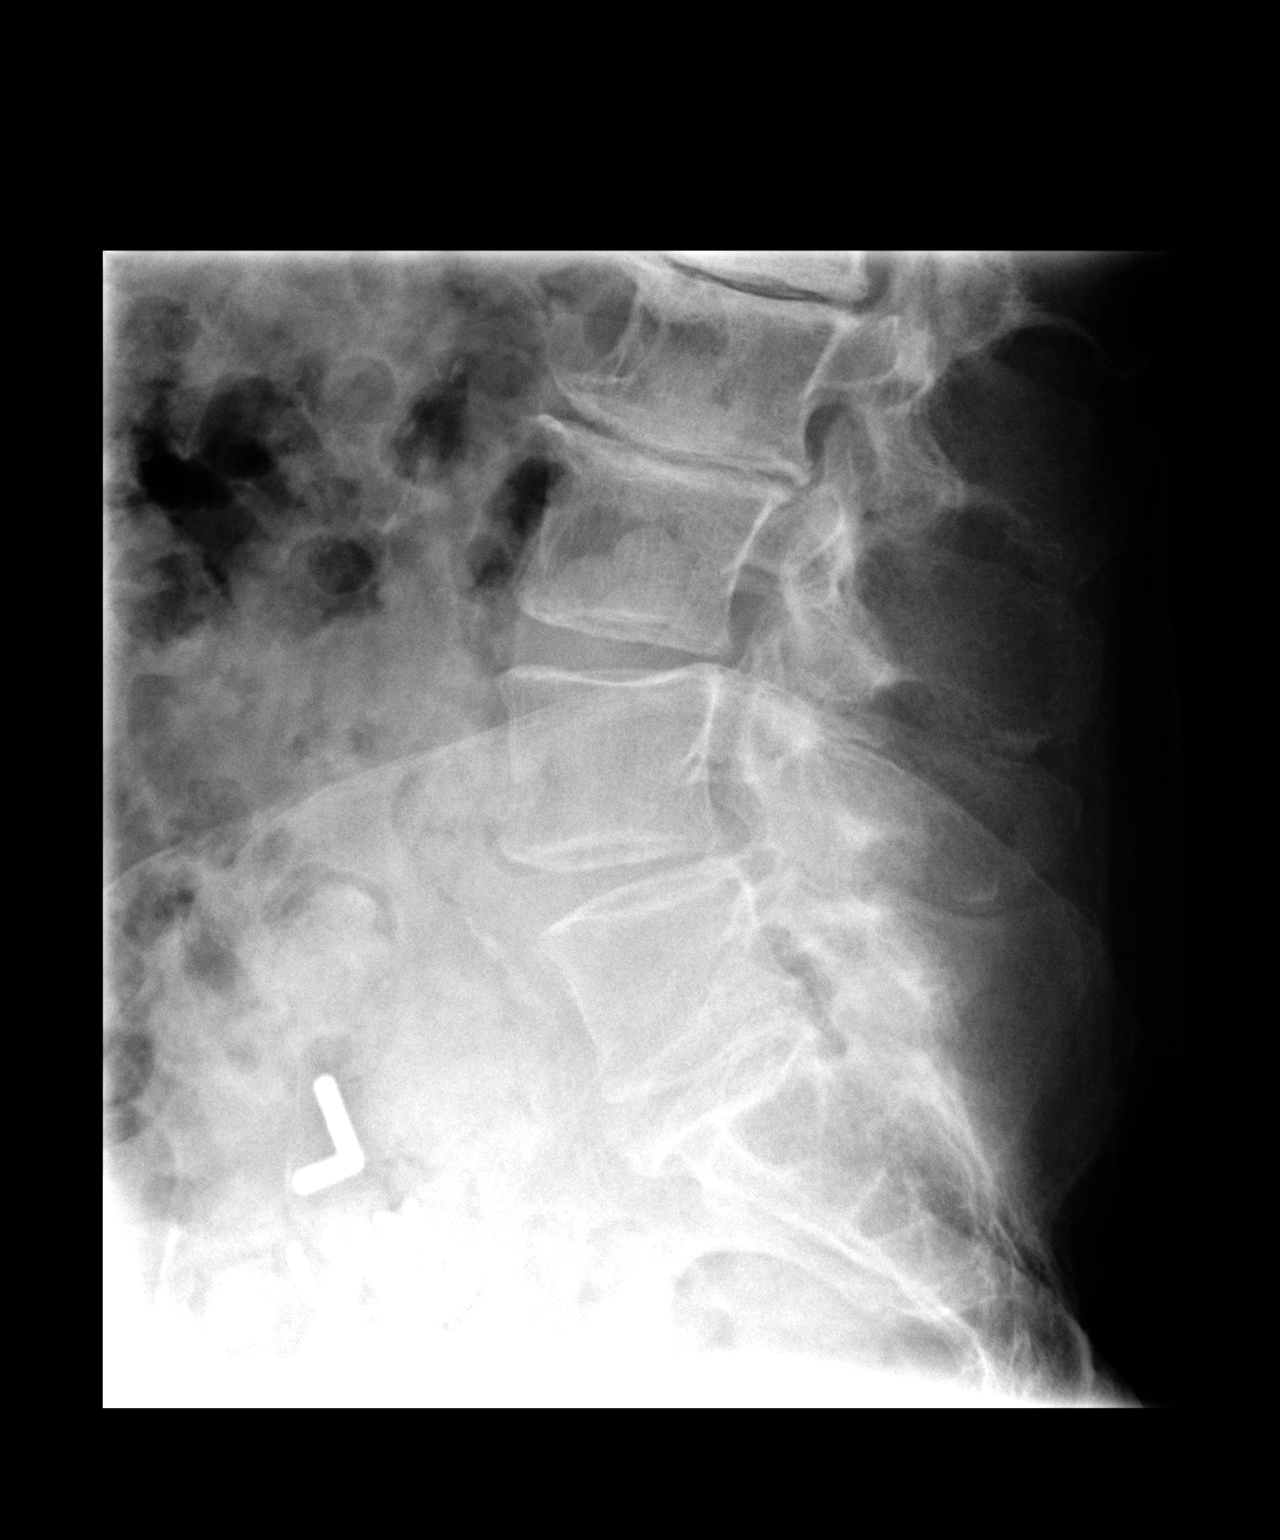

[3 of 3 positions shown; findings below may reference images not displayed]

FINDINGS: Alignment is anatomic.  Endplate degenerative changes, loss of disk space, height, and vacuum disk phenomenon are worst from T11-12 through L2-3.  Facet hypertrophy throughout.  Mild loss of disk space height at L5-S1.
IMPRESSION: Spondylosis, as above.

## 2008-08-08 ENCOUNTER — Telehealth: Payer: Self-pay | Admitting: Internal Medicine

## 2008-08-17 ENCOUNTER — Ambulatory Visit: Payer: Self-pay | Admitting: Internal Medicine

## 2008-08-21 DIAGNOSIS — E785 Hyperlipidemia, unspecified: Secondary | ICD-10-CM

## 2008-09-05 ENCOUNTER — Ambulatory Visit: Payer: Self-pay | Admitting: Internal Medicine

## 2008-10-03 ENCOUNTER — Encounter: Payer: Self-pay | Admitting: Internal Medicine

## 2008-10-23 ENCOUNTER — Encounter: Payer: Self-pay | Admitting: Internal Medicine

## 2008-12-18 ENCOUNTER — Encounter: Payer: Self-pay | Admitting: Internal Medicine

## 2008-12-22 ENCOUNTER — Ambulatory Visit: Payer: Self-pay | Admitting: Internal Medicine

## 2008-12-22 LAB — CONVERTED CEMR LAB
ALT: 24 units/L (ref 0–35)
Bilirubin, Direct: 0.1 mg/dL (ref 0.0–0.3)
Calcium: 9.5 mg/dL (ref 8.4–10.5)
Cholesterol: 323 mg/dL (ref 0–200)
Creatinine, Ser: 0.9 mg/dL (ref 0.4–1.2)
Direct LDL: 160.1 mg/dL
GFR calc Af Amer: 78 mL/min
HDL: 132.3 mg/dL (ref >39.0)
Sodium: 143 meq/L (ref 135–145)
Total Bilirubin: 1 mg/dL (ref 0.3–1.2)
Total CHOL/HDL Ratio: 2.4
Total Protein: 7.2 g/dL (ref 6.0–8.3)
Triglycerides: 79 mg/dL (ref 0–149)

## 2009-01-12 ENCOUNTER — Ambulatory Visit: Payer: Self-pay | Admitting: Oncology

## 2009-01-26 ENCOUNTER — Encounter: Payer: Self-pay | Admitting: Internal Medicine

## 2009-01-26 LAB — CBC WITH DIFFERENTIAL/PLATELET
Eosinophils Absolute: 0 10*3/uL (ref 0.0–0.5)
MONO#: 0.2 10*3/uL (ref 0.1–0.9)
NEUT#: 5.2 10*3/uL (ref 1.5–6.5)
RBC: 4.08 10*6/uL (ref 3.70–5.32)
RDW: 16.5 % — ABNORMAL HIGH (ref 11.3–14.5)
WBC: 7.7 10*3/uL (ref 3.9–10.0)

## 2009-01-26 LAB — COMPREHENSIVE METABOLIC PANEL
Albumin: 3.7 g/dL (ref 3.5–5.2)
CO2: 33 mEq/L — ABNORMAL HIGH (ref 19–32)
Chloride: 101 mEq/L (ref 96–112)
Glucose, Bld: 115 mg/dL — ABNORMAL HIGH (ref 70–99)
Potassium: 2.9 mEq/L — ABNORMAL LOW (ref 3.5–5.3)
Sodium: 141 mEq/L (ref 135–145)
Total Protein: 6 g/dL (ref 6.0–8.3)

## 2009-01-26 LAB — CANCER ANTIGEN 27.29: CA 27.29: 29 U/mL (ref 0–39)

## 2009-02-16 ENCOUNTER — Telehealth: Payer: Self-pay | Admitting: Internal Medicine

## 2009-02-16 ENCOUNTER — Encounter: Payer: Self-pay | Admitting: Internal Medicine

## 2009-05-24 ENCOUNTER — Encounter: Payer: Self-pay | Admitting: Internal Medicine

## 2009-06-19 ENCOUNTER — Ambulatory Visit: Payer: Self-pay | Admitting: Internal Medicine

## 2009-06-20 ENCOUNTER — Telehealth (INDEPENDENT_AMBULATORY_CARE_PROVIDER_SITE_OTHER): Payer: Self-pay | Admitting: *Deleted

## 2009-06-22 ENCOUNTER — Ambulatory Visit: Payer: Self-pay | Admitting: Cardiology

## 2009-06-22 DIAGNOSIS — N289 Disorder of kidney and ureter, unspecified: Secondary | ICD-10-CM | POA: Insufficient documentation

## 2009-06-22 LAB — CONVERTED CEMR LAB
BUN: 15 mg/dL (ref 6–23)
Creatinine, Ser: 1 mg/dL (ref 0.4–1.2)

## 2009-06-25 ENCOUNTER — Encounter: Payer: Self-pay | Admitting: Internal Medicine

## 2009-07-04 LAB — CONVERTED CEMR LAB
ALT: 18 units/L (ref 0–35)
BUN: 22 mg/dL (ref 6–23)
Bilirubin, Direct: 0 mg/dL (ref 0.0–0.3)
Chloride: 101 meq/L (ref 96–112)
Creatinine, Ser: 1.1 mg/dL (ref 0.4–1.2)
GFR calc non Af Amer: 62.1 mL/min (ref >60)
Potassium: 3.2 meq/L — ABNORMAL LOW (ref 3.5–5.1)
Total Bilirubin: 1 mg/dL (ref 0.3–1.2)

## 2009-07-18 ENCOUNTER — Encounter: Admission: RE | Admit: 2009-07-18 | Discharge: 2009-07-18 | Payer: Self-pay | Admitting: Oncology

## 2009-07-24 ENCOUNTER — Ambulatory Visit: Payer: Self-pay | Admitting: Oncology

## 2009-09-24 ENCOUNTER — Ambulatory Visit: Payer: Self-pay | Admitting: Oncology

## 2009-09-26 ENCOUNTER — Encounter (INDEPENDENT_AMBULATORY_CARE_PROVIDER_SITE_OTHER): Payer: Self-pay | Admitting: *Deleted

## 2009-09-26 LAB — CBC WITH DIFFERENTIAL/PLATELET
BASO%: 2.2 % — ABNORMAL HIGH (ref 0.0–2.0)
HCT: 44.3 % (ref 34.8–46.6)
MCHC: 33.5 g/dL (ref 31.5–36.0)
MONO#: 0.7 10*3/uL (ref 0.1–0.9)
NEUT%: 42.6 % (ref 38.4–76.8)
RBC: 4.76 10*6/uL (ref 3.70–5.45)
WBC: 8.6 10*3/uL (ref 3.9–10.3)
lymph#: 4 10*3/uL — ABNORMAL HIGH (ref 0.9–3.3)

## 2009-09-26 LAB — COMPREHENSIVE METABOLIC PANEL
ALT: 19 U/L (ref 0–35)
CO2: 25 mEq/L (ref 19–32)
Calcium: 10.2 mg/dL (ref 8.4–10.5)
Chloride: 104 mEq/L (ref 96–112)
Potassium: 3.4 mEq/L — ABNORMAL LOW (ref 3.5–5.3)
Sodium: 144 mEq/L (ref 135–145)
Total Protein: 6.7 g/dL (ref 6.0–8.3)

## 2009-10-09 ENCOUNTER — Encounter: Admission: RE | Admit: 2009-10-09 | Discharge: 2009-10-09 | Payer: Self-pay | Admitting: Oncology

## 2009-10-15 ENCOUNTER — Encounter (INDEPENDENT_AMBULATORY_CARE_PROVIDER_SITE_OTHER): Payer: Self-pay | Admitting: *Deleted

## 2009-11-16 ENCOUNTER — Telehealth: Payer: Self-pay | Admitting: Internal Medicine

## 2010-03-19 ENCOUNTER — Ambulatory Visit: Payer: Self-pay | Admitting: Oncology

## 2010-03-21 LAB — COMPREHENSIVE METABOLIC PANEL
Albumin: 4.4 g/dL (ref 3.5–5.2)
Alkaline Phosphatase: 83 U/L (ref 39–117)
BUN: 23 mg/dL (ref 6–23)
Glucose, Bld: 125 mg/dL — ABNORMAL HIGH (ref 70–99)
Potassium: 3.8 mEq/L (ref 3.5–5.3)

## 2010-03-21 LAB — CBC WITH DIFFERENTIAL/PLATELET
Basophils Absolute: 0 10*3/uL (ref 0.0–0.1)
EOS%: 0.4 % (ref 0.0–7.0)
HCT: 43.2 % (ref 34.8–46.6)
HGB: 14.7 g/dL (ref 11.6–15.9)
LYMPH%: 35.9 % (ref 14.0–49.7)
MCH: 29.6 pg (ref 25.1–34.0)
MCV: 87.1 fL (ref 79.5–101.0)
MONO%: 13 % (ref 0.0–14.0)
NEUT%: 50.2 % (ref 38.4–76.8)
Platelets: 228 10*3/uL (ref 145–400)
lymph#: 2.8 10*3/uL (ref 0.9–3.3)

## 2010-03-21 LAB — VITAMIN D 25 HYDROXY (VIT D DEFICIENCY, FRACTURES): Vit D, 25-Hydroxy: 54 ng/mL (ref 30–89)

## 2010-03-25 ENCOUNTER — Encounter: Payer: Self-pay | Admitting: Internal Medicine

## 2010-04-26 ENCOUNTER — Telehealth: Payer: Self-pay | Admitting: Internal Medicine

## 2010-04-29 ENCOUNTER — Ambulatory Visit: Payer: Self-pay | Admitting: Internal Medicine

## 2010-04-29 LAB — CONVERTED CEMR LAB: Uric Acid, Serum: 4.4 mg/dL (ref 2.4–7.0)

## 2010-04-30 ENCOUNTER — Encounter: Payer: Self-pay | Admitting: Internal Medicine

## 2010-04-30 DIAGNOSIS — R609 Edema, unspecified: Secondary | ICD-10-CM | POA: Insufficient documentation

## 2010-05-01 ENCOUNTER — Encounter: Payer: Self-pay | Admitting: Internal Medicine

## 2010-05-01 ENCOUNTER — Ambulatory Visit: Payer: Self-pay

## 2010-05-02 ENCOUNTER — Telehealth: Payer: Self-pay | Admitting: Internal Medicine

## 2010-05-02 ENCOUNTER — Ambulatory Visit: Payer: Self-pay | Admitting: Internal Medicine

## 2010-05-02 DIAGNOSIS — I82409 Acute embolism and thrombosis of unspecified deep veins of unspecified lower extremity: Secondary | ICD-10-CM | POA: Insufficient documentation

## 2010-05-06 ENCOUNTER — Ambulatory Visit: Payer: Self-pay | Admitting: Family Medicine

## 2010-05-06 LAB — CONVERTED CEMR LAB
INR: 1
Prothrombin Time: 12.4 s

## 2010-05-10 ENCOUNTER — Ambulatory Visit: Payer: Self-pay | Admitting: Internal Medicine

## 2010-05-10 LAB — CONVERTED CEMR LAB
INR: 1.8
Prothrombin Time: 16.4 s

## 2010-05-29 ENCOUNTER — Telehealth: Payer: Self-pay | Admitting: Internal Medicine

## 2010-05-29 ENCOUNTER — Ambulatory Visit: Payer: Self-pay | Admitting: Internal Medicine

## 2010-05-29 LAB — CONVERTED CEMR LAB: INR: 1

## 2010-06-05 ENCOUNTER — Ambulatory Visit: Payer: Self-pay | Admitting: Internal Medicine

## 2010-06-20 ENCOUNTER — Ambulatory Visit: Payer: Self-pay | Admitting: Internal Medicine

## 2010-06-21 ENCOUNTER — Telehealth: Payer: Self-pay | Admitting: Internal Medicine

## 2010-07-02 ENCOUNTER — Telehealth: Payer: Self-pay | Admitting: Internal Medicine

## 2010-07-04 ENCOUNTER — Ambulatory Visit: Payer: Self-pay | Admitting: Internal Medicine

## 2010-07-09 ENCOUNTER — Ambulatory Visit: Payer: Self-pay | Admitting: Internal Medicine

## 2010-07-10 LAB — CONVERTED CEMR LAB
ALT: 30 units/L (ref 0–35)
BUN: 26 mg/dL — ABNORMAL HIGH (ref 6–23)
Bilirubin, Direct: 0.1 mg/dL (ref 0.0–0.3)
CO2: 32 meq/L (ref 19–32)
Chloride: 101 meq/L (ref 96–112)
Creatinine, Ser: 1.1 mg/dL (ref 0.4–1.2)
Eosinophils Absolute: 0 10*3/uL (ref 0.0–0.7)
Eosinophils Relative: 0.2 % (ref 0.0–5.0)
Glucose, Bld: 112 mg/dL — ABNORMAL HIGH (ref 70–99)
HCT: 42.3 % (ref 36.0–46.0)
Lymphs Abs: 1.2 10*3/uL (ref 0.7–4.0)
MCHC: 33.3 g/dL (ref 30.0–36.0)
MCV: 90.9 fL (ref 78.0–100.0)
Monocytes Absolute: 0.3 10*3/uL (ref 0.1–1.0)
Neutrophils Relative %: 78.8 % — ABNORMAL HIGH (ref 43.0–77.0)
Platelets: 305 10*3/uL (ref 150.0–400.0)
Potassium: 3.5 meq/L (ref 3.5–5.1)
RDW: 15.7 % — ABNORMAL HIGH (ref 11.5–14.6)
Total Bilirubin: 0.8 mg/dL (ref 0.3–1.2)
WBC: 7.2 10*3/uL (ref 4.5–10.5)

## 2010-07-24 ENCOUNTER — Ambulatory Visit: Payer: Self-pay | Admitting: Internal Medicine

## 2010-07-24 LAB — CONVERTED CEMR LAB: INR: 3.5

## 2010-08-02 ENCOUNTER — Encounter: Admission: RE | Admit: 2010-08-02 | Discharge: 2010-08-02 | Payer: Self-pay | Admitting: Oncology

## 2010-08-02 LAB — HM MAMMOGRAPHY

## 2010-08-21 ENCOUNTER — Ambulatory Visit: Payer: Self-pay | Admitting: Internal Medicine

## 2010-08-21 LAB — CONVERTED CEMR LAB: INR: 3.2

## 2010-09-20 ENCOUNTER — Ambulatory Visit: Payer: Self-pay | Admitting: Oncology

## 2010-09-23 ENCOUNTER — Ambulatory Visit: Payer: Self-pay | Admitting: Internal Medicine

## 2010-09-23 LAB — CBC WITH DIFFERENTIAL/PLATELET
BASO%: 0.5 % (ref 0.0–2.0)
LYMPH%: 37.4 % (ref 14.0–49.7)
MCHC: 32.5 g/dL (ref 31.5–36.0)
MCV: 89.9 fL (ref 79.5–101.0)
MONO#: 0.7 10*3/uL (ref 0.1–0.9)
MONO%: 8.3 % (ref 0.0–14.0)
Platelets: 303 10*3/uL (ref 145–400)
RBC: 4.45 10*6/uL (ref 3.70–5.45)
RDW: 15.3 % — ABNORMAL HIGH (ref 11.2–14.5)
WBC: 8.2 10*3/uL (ref 3.9–10.3)
nRBC: 0 % (ref 0–0)

## 2010-09-23 LAB — CONVERTED CEMR LAB: INR: 1.3

## 2010-09-24 LAB — COMPREHENSIVE METABOLIC PANEL
ALT: 20 U/L (ref 0–35)
AST: 28 U/L (ref 0–37)
Creatinine, Ser: 1.03 mg/dL (ref 0.40–1.20)
Sodium: 144 mEq/L (ref 135–145)
Total Bilirubin: 0.4 mg/dL (ref 0.3–1.2)

## 2010-10-02 ENCOUNTER — Ambulatory Visit: Payer: Self-pay | Admitting: Internal Medicine

## 2011-01-16 NOTE — Assessment & Plan Note (Signed)
Summary: 2 wk follow up/cjr   Vital Signs:  Patient profile:   77 year old female Pulse rate:   78 / minute Pulse rhythm:   irregular Resp:     12 per minute BP sitting:   136 / 84  (left arm) Cuff size:   regular  Vitals Entered By: Gladis Riffle, RN (May 29, 2010 12:06 PM) CC: 2 week rov, was on warfarin for blood clot in foot and now off, leg better Is Patient Diabetic? No   CC:  2 week rov, was on warfarin for blood clot in foot and now off, and leg better.  History of Present Illness:  Follow-Up Visit      This is a 77 year old woman who presents for Follow-up visit.  The patient denies chest pain and palpitations.  Since the last visit the patient notes no new problems or concerns.  The patient reports taking meds as prescribed.  When questioned about possible medication side effects, the patient notes none.    All other systems reviewed and were negative   Preventive Screening-Counseling & Management  Alcohol-Tobacco     Alcohol drinks/day: <1     Smoking Status: quit     Year Quit: age 3  Current Medications (verified): 1)  Clonidine Hcl 0.1 Mg  Tabs (Clonidine Hcl) .... Take 1 Tablet By Mouth Two Times A Day 2)  Potassium Chloride Crys Cr 20 Meq Cr-Tabs (Potassium Chloride Crys Cr) .... Take 1 Tablet By Mouth Once A Day 3)  Hydrocodone-Acetaminophen 5-500 Mg Tabs (Hydrocodone-Acetaminophen) .... Take 1 Tablet By Mouth Two Times A Day As Needed 4)  Prednisone 5 Mg Tabs (Prednisone) .... Take 1 Tablet By Mouth Once A Day 5)  Arava 20 Mg Tabs (Leflunomide) .... Take 1 Tablet By Mouth Once A Day 6)  Anastrozole 1 Mg Tabs (Anastrozole) .... Take 1 Tablet By Mouth Once A Day 7)  Actonel 150 Mg Tabs (Risedronate Sodium) .... One On First Day of Month Every Month As Directed 8)  Hydrochlorothiazide 25 Mg Tabs (Hydrochlorothiazide) .... Take 1 Tablet By Mouth Once A Day 9)  Gabapentin 300 Mg Caps (Gabapentin) .... Take 1 Tablet By Mouth At Bedtime  Allergies (verified): No  Known Drug Allergies  Past History:  Past Medical History: Last updated: 05/02/2010 Breast cancer, hx of GERD Hypertension Rheumatoid arthritis Cerebrovascular accident, hx of reviewed chart---endocarditis---MRSA pulmonary emboli---?septic emboli Pulmonary Embolism, hx of Hyperlipidemia lower extremity deep vein thrombosis  Past Surgical History: Last updated: 03/06/2008 Partial Hysterectomy (1995) Tubaligation (1965) Rt Breast Lumpectomy; ca (2007) Tonsillectomy  Vein Stripping (1971) Right Rotator Cuff Repair (1986) Posterior Vaginal repair of rectocele (1998) Peg tube Placement (04/2002)  Family History: Last updated: 08-27-2007 father deceased-MI age 94 mother deceased pancreatic CA 65 yo  Social History: Last updated: 08-27-2007 Retired Former Smoker Al Regular exercise-no Alcohol use-yes  Risk Factors: Alcohol Use: <1 (05/29/2010) Exercise: no (Aug 27, 2007)  Risk Factors: Smoking Status: quit (05/29/2010)  Physical Exam  General:  alert and well-developed.   Head:  normocephalic and atraumatic.   Eyes:  pupils equal and pupils round.   Neck:  No deformities, masses, or tenderness noted. Chest Wall:  no deformities.   Lungs:  Normal respiratory effort, chest expands symmetrically. Lungs are clear to auscultation, no crackles or wheezes. Heart:  Normal rate and regular rhythm. S1 and S2 normal without gallop, murmur, click, rub or other extra sounds. Abdomen:  Bowel sounds positive,abdomen soft and non-tender without masses, organomegaly or hernias noted. Msk:  slight tenderness over the right medial ankle region.    No leg tenderness Neurologic:  cranial nerves II-XII intact and strength normal in all extremities.   Skin:  turgor normal and color normal.     Impression & Recommendations:  Problem # 1:  DEEP VENOUS THROMBOPHLEBITIS, CHRONIC (ICD-453.40)  has had 2 documented dvts  lifelong warfarin  Orders: Protime ([id number]) Fingerstick  ([id number])  Problem # 2:  COUMADIN THERAPY (ICD-V58.61) life long warfarin side effectsw discusssed Orders: Protime ([id number]) Fingerstick ([id number])  Problem # 3:  DEPENDENT EDEMA, RIGHT LEG (ICD-782.3) fresolved Her updated medication list for this problem includes:    Hydrochlorothiazide 25 Mg Tabs (Hydrochlorothiazide) .[name]... Take 1 tablet by mouth once a day  Complete Medication List: 1)  Clonidine Hcl 0.1 Mg Tabs (Clonidine hcl) .... Take 1 tablet by mouth two times a day 2)  Potassium Chloride Crys Cr 20 Meq Cr-tabs (Potassium chloride crys cr) .... Take 1 tablet by mouth once a day 3)  Hydrocodone-acetaminophen 5-500 Mg Tabs (Hydrocodone-acetaminophen) .... Take 1 tablet by mouth two times a day as needed 4)  Prednisone 5 Mg Tabs (Prednisone) .... Take 1 tablet by mouth once a day 5)  Arava 20 Mg Tabs (Leflunomide) .... Take 1 tablet by mouth once a day 6)  Anastrozole 1 Mg Tabs (Anastrozole) .... Take 1 tablet by mouth once a day 7)  Actonel 150 Mg Tabs (Risedronate sodium) .... One on first day of month every month as directed 8)  Hydrochlorothiazide 25 Mg Tabs (Hydrochlorothiazide) .... Take 1 tablet by mouth once a day 9)  Gabapentin 300 Mg Caps (Gabapentin) .... Take 1 tablet by mouth at bedtime 10)  Warfarin Sodium 5 Mg Tabs (Warfarin sodium) .... Take 1 tablet by mouth once a day  Patient Instructions: 1)  6 weeks Prescriptions: WARFARIN SODIUM 5 MG TABS (WARFARIN SODIUM) Take 1 tablet by mouth once a day  #30 x 5   Entered by:   Gladis Riffle, RN   Authorized by:   Birdie Sons MD   Signed by:   Gladis Riffle, RN on 05/29/2010   Method used:   Electronically to        [facility] (retail)       [address]       [place], [place]  [id number]       Ph: [phone]       Fax: [phone]   RxID:   [id number]   Laboratory Results   Blood Tests   Date/Time Recieved: May 29, 2010 12:46 PM  Date/Time Reported: May 29, 2010 12:46 PM     INR: 1.0   (Normal Range: 0.88-1.12   Therap INR: 2.0-3.5) Comments: Wynona Canes, CMA  May 29, 2010 12:46 PM       ANTICOAGULATION RECORD PREVIOUS REGIMEN & LAB RESULTS Anticoagulation Diagnosis:  V58.83,V58.61,434.91,415.19, on  05/06/2010  Previous INR:  1.8 on  05/10/2010 Previous Coumadin Dose(mg):  5mg  qd on  05/10/2010 Previous Regimen:  Same dose on  05/10/2010  NEW REGIMEN & LAB RESULTS Current INR: 1.0 Regimen: Same dose  (no change)       Repeat testing in: 1 week MEDICATIONS CLONIDINE HCL 0.1 MG  TABS (CLONIDINE HCL) Take 1 tablet by mouth two times a day POTASSIUM CHLORIDE CRYS CR 20 MEQ CR-TABS (POTASSIUM CHLORIDE CRYS CR) Take 1 tablet by mouth once a day HYDROCODONE-ACETAMINOPHEN 5-500 MG TABS (HYDROCODONE-ACETAMINOPHEN) Take 1 tablet by mouth two times a day as needed PREDNISONE 5  MG TABS (PREDNISONE) Take 1 tablet by mouth once a day ARAVA 20 MG TABS (LEFLUNOMIDE) Take 1 tablet by mouth once a day ANASTROZOLE 1 MG TABS (ANASTROZOLE) Take 1 tablet by mouth once a day ACTONEL 150 MG TABS (RISEDRONATE SODIUM) one on first day of month every month as directed HYDROCHLOROTHIAZIDE 25 MG TABS (HYDROCHLOROTHIAZIDE) Take 1 tablet by mouth once a day GABAPENTIN 300 MG CAPS (GABAPENTIN) Take 1 tablet by mouth at bedtime WARFARIN SODIUM 5 MG TABS (WARFARIN SODIUM) Take 1 tablet by mouth once a day   Anticoagulation Visit Questionnaire      Coumadin dose missed/changed:  Yes      Coumadin Dose Comments:  one or more missed dose(s)      Abnormal Bleeding Symptoms:  No   Any diet changes including alcohol intake, vegetables or greens since the last visit:  No Any illnesses or hospitalizations since the last visit:  No Any signs of clotting since the last visit (including chest discomfort, dizziness, shortness of breath, arm tingling, slurred speech, swelling or redness in leg):  No

## 2011-01-16 NOTE — Letter (Signed)
Summary: [facility]  [facility]   Imported By: Maryln Gottron 11/01/2010 15:36:18  _____________________________________________________________________  External Attachment:    Type:   Image     Comment:   External Document

## 2011-01-16 NOTE — Assessment & Plan Note (Signed)
Summary: LEG SWOLLEN OK PER MD//SLM   Vital Signs:  Patient profile:   77 year old female Weight:      127 pounds Temp:     98.4 degrees F Pulse rate:   74 / minute Pulse rhythm:   regular Resp:     12 per minute BP sitting:   118 / 76  (left arm) Cuff size:   regular  Vitals Entered By: Gladis Riffle, RN (Apr 29, 2010 11:41 AM) CC: c/o right foot swelling since 5/13, worse so includes foot also Is Patient Diabetic? No   CC:  c/o right foot swelling since 5/13 and worse so includes foot also.  History of Present Illness: Righ ankle discomfort duration 3 days she says it is swollen noted some redness today (none until today) She brings in an aid (provides part of the hx)  All other systems reviewed and were negative   Preventive Screening-Counseling & Management  Alcohol-Tobacco     Alcohol drinks/day: <1     Smoking Status: quit     Year Quit: age 65  Current Medications (verified): 1)  Clonidine Hcl 0.1 Mg  Tabs (Clonidine Hcl) .... Take 1 Tablet By Mouth Two Times A Day 2)  Potassium Chloride Crys Cr 20 Meq Cr-Tabs (Potassium Chloride Crys Cr) .... Take 1 Tablet By Mouth Once A Day 3)  Hydrocodone-Acetaminophen 5-500 Mg Tabs (Hydrocodone-Acetaminophen) .... Take 1 Tablet By Mouth Two Times A Day As Needed 4)  Prednisone 5 Mg Tabs (Prednisone) .... Take 1 Tablet By Mouth Once A Day 5)  Arava 20 Mg Tabs (Leflunomide) .... Take 1 Tablet By Mouth Once A Day 6)  Anastrozole 1 Mg Tabs (Anastrozole) .... Take 1 Tablet By Mouth Once A Day 7)  Actonel 150 Mg Tabs (Risedronate Sodium) .... One On First Day of Month Every Month As Directed 8)  Zaditor 0.025 % Soln (Ketotifen Fumarate) .... Once Daily As Directed 9)  Blink Gel Tears .... As Directed As Needed  Allergies (verified): No Known Drug Allergies  Past History:  Past Medical History: Last updated: 08/17/2008 Breast cancer, hx of GERD Hypertension Rheumatoid arthritis Cerebrovascular accident, hx of reviewed  chart---endocarditis---MRSA pulmonary emboli---?septic emboli Pulmonary Embolism, hx of Hyperlipidemia  Past Surgical History: Last updated: 03/06/2008 Partial Hysterectomy (1995) Tubaligation (1965) Rt Breast Lumpectomy; ca (2007) Tonsillectomy  Vein Stripping (1971) Right Rotator Cuff Repair (1986) Posterior Vaginal repair of rectocele (1998) Peg tube Placement (04/2002)  Family History: Last updated: 2007/08/20 father deceased-MI age 10 mother deceased pancreatic CA 81 yo  Social History: Last updated: August 20, 2007 Retired Former Smoker Al Regular exercise-no Alcohol use-yes  Risk Factors: Alcohol Use: <1 (04/29/2010) Exercise: no (2007/08/20)  Risk Factors: Smoking Status: quit (04/29/2010)  Physical Exam  General:  alert, well-hydrated, , no distress Head:  normocephalic and atraumatic.   Eyes:  pupils equal and pupils round.   Ears:  R ear normal and L ear normal.   Neck:  No deformities, masses, or tenderness noted. Chest Wall:  no deformities.   Lungs:  no dullness and no fremitus.   Heart:  normal rate and regular rhythm.   Abdomen:  Bowel sounds positive,abdomen soft and non-tender without masses, organomegaly or hernias noted. Msk:  No deformity or scoliosis noted of thoracic or lumbar spine.   Extremities:  FROM both ankles minimal erythema around right medial malleolus Neurologic:  broad based gait uses cane for ambulation   Impression & Recommendations:  Problem # 1:  GOUT, HX OF (ICD-V12.2)  ? recurrence  check labs and then decide treatment plan sxs are much better discussed with patient and her care-giver  Orders: Venipuncture ([id number]) TLB-Uric Acid, Blood ([id number])  Problem # 2:  PULMONARY EMBOLISM, HX OF (ICD-V12.51)  hx of --- she can't remember the details  Orders: Venipuncture ([id number]) T-D-Dimer Fibrin Derivatives Quantitive ([id number])  Complete Medication List: 1)  Clonidine Hcl 0.1 Mg Tabs (Clonidine hcl) .... Take 1  tablet by mouth two times a day 2)  Potassium Chloride Crys Cr 20 Meq Cr-tabs (Potassium chloride crys cr) .... Take 1 tablet by mouth once a day 3)  Hydrocodone-acetaminophen 5-500 Mg Tabs (Hydrocodone-acetaminophen) .... Take 1 tablet by mouth two times a day as needed 4)  Prednisone 5 Mg Tabs (Prednisone) .... Take 1 tablet by mouth once a day 5)  Arava 20 Mg Tabs (Leflunomide) .... Take 1 tablet by mouth once a day 6)  Anastrozole 1 Mg Tabs (Anastrozole) .... Take 1 tablet by mouth once a day 7)  Actonel 150 Mg Tabs (Risedronate sodium) .... One on first day of month every month as directed 8)  Zaditor 0.025 % Soln (Ketotifen fumarate) .... Once daily as directed 9)  Blink Gel Tears  .... As directed as needed

## 2011-01-16 NOTE — Assessment & Plan Note (Signed)
Summary: 4 day rov/njr   Vital Signs:  Patient profile:   77 year old female Pulse rate:   66 / minute Pulse rhythm:   irregular Resp:     12 per minute BP sitting:   158 / 68  (left arm) Cuff size:   regular  Vitals Entered By: Gladis Riffle, RN (May 10, 2010 11:35 AM) CC: 4 day rov, swelling resolved and discoloration improving--needs PT today Is Patient Diabetic? No   CC:  4 day rov and swelling resolved and discoloration improving--needs PT today.  History of Present Illness:  Follow-Up Visit      This is a 77 year old woman who presents for Follow-up visit.  The patient denies chest pain, palpitations, and SOB.  Since the last visit the patient notes no new problems or concerns.  The patient reports taking meds as prescribed.  When questioned about possible medication side effects, the patient notes none.  tolerating warfarin, says LE edema has resolved  All other systems reviewed and were negative   Preventive Screening-Counseling & Management  Alcohol-Tobacco     Alcohol drinks/day: <1     Smoking Status: quit     Year Quit: age 28  Current Medications (verified): 1)  Clonidine Hcl 0.1 Mg  Tabs (Clonidine Hcl) .... Take 1 Tablet By Mouth Two Times A Day 2)  Potassium Chloride Crys Cr 20 Meq Cr-Tabs (Potassium Chloride Crys Cr) .... Take 1 Tablet By Mouth Once A Day 3)  Hydrocodone-Acetaminophen 5-500 Mg Tabs (Hydrocodone-Acetaminophen) .... Take 1 Tablet By Mouth Two Times A Day As Needed 4)  Prednisone 5 Mg Tabs (Prednisone) .... Take 1 Tablet By Mouth Once A Day 5)  Arava 20 Mg Tabs (Leflunomide) .... Take 1 Tablet By Mouth Once A Day 6)  Anastrozole 1 Mg Tabs (Anastrozole) .... Take 1 Tablet By Mouth Once A Day 7)  Actonel 150 Mg Tabs (Risedronate Sodium) .... One On First Day of Month Every Month As Directed 8)  Zaditor 0.025 % Soln (Ketotifen Fumarate) .... Once Daily As Directed 9)  Blink Gel Tears .... As Directed As Needed 10)  Warfarin Sodium 2.5 Mg Tabs  (Warfarin Sodium) .... One Daily  Allergies (verified): No Known Drug Allergies  Past History:  Past Medical History: Last updated: 05/02/2010 Breast cancer, hx of GERD Hypertension Rheumatoid arthritis Cerebrovascular accident, hx of reviewed chart---endocarditis---MRSA pulmonary emboli---?septic emboli Pulmonary Embolism, hx of Hyperlipidemia lower extremity deep vein thrombosis  Past Surgical History: Last updated: 03/06/2008 Partial Hysterectomy (1995) Tubaligation (1965) Rt Breast Lumpectomy; ca (2007) Tonsillectomy  Vein Stripping (1971) Right Rotator Cuff Repair (1986) Posterior Vaginal repair of rectocele (1998) Peg tube Placement (04/2002)  Family History: Last updated: 2007/08/22 father deceased-MI age 68 mother deceased pancreatic CA 38 yo  Social History: Last updated: Aug 22, 2007 Retired Former Smoker Al Regular exercise-no Alcohol use-yes  Risk Factors: Alcohol Use: <1 (05/10/2010) Exercise: no (2007-08-22)  Risk Factors: Smoking Status: quit (05/10/2010)  Physical Exam  General:  alert and well-developed.   Head:  normocephalic and atraumatic.   Eyes:  pupils equal and pupils round.   Neck:  No deformities, masses, or tenderness noted. Chest Wall:  no deformities.   Lungs:  Normal respiratory effort, chest expands symmetrically. Lungs are clear to auscultation, no crackles or wheezes. Abdomen:  Bowel sounds positive,abdomen soft and non-tender without masses, organomegaly or hernias noted. Pulses:  R radial normal and L radial normal.   Extremities:  no edema present in lower extremities   Impression & Recommendations:  Problem # 1:  DEEP VENOUS THROMBOPHLEBITIS, CHRONIC (ICD-453.40) on warfarin goal inr 2-3 Orders: Fingerstick ([id number])  Problem # 2:  COUMADIN THERAPY (ICD-V58.61) no bleeding complications check protime today  Orders: Fingerstick ([id number])  Problem # 3:  DEPENDENT EDEMA, RIGHT LEG  (ICD-782.3) resolved  Complete Medication List: 1)  Clonidine Hcl 0.1 Mg Tabs (Clonidine hcl) .... Take 1 tablet by mouth two times a day 2)  Potassium Chloride Crys Cr 20 Meq Cr-tabs (Potassium chloride crys cr) .... Take 1 tablet by mouth once a day 3)  Hydrocodone-acetaminophen 5-500 Mg Tabs (Hydrocodone-acetaminophen) .... Take 1 tablet by mouth two times a day as needed 4)  Prednisone 5 Mg Tabs (Prednisone) .... Take 1 tablet by mouth once a day 5)  Arava 20 Mg Tabs (Leflunomide) .... Take 1 tablet by mouth once a day 6)  Anastrozole 1 Mg Tabs (Anastrozole) .... Take 1 tablet by mouth once a day 7)  Actonel 150 Mg Tabs (Risedronate sodium) .... One on first day of month every month as directed 8)  Zaditor 0.025 % Soln (Ketotifen fumarate) .... Once daily as directed 9)  Blink Gel Tears  .... As directed as needed 10)  Warfarin Sodium 2.5 Mg Tabs (Warfarin sodium) .... One daily  Laboratory Results   Blood Tests   Date/Time Recieved: May 10, 2010 12:17 PM  Date/Time Reported: May 10, 2010 12:17 PM   PT: 16.4 s   (Normal Range: 10.6-13.4)  INR: 1.8   (Normal Range: 0.88-1.12   Therap INR: 2.0-3.5) Comments: Wynona Canes, CMA  May 10, 2010 12:17 PM       ANTICOAGULATION RECORD PREVIOUS REGIMEN & LAB RESULTS Anticoagulation Diagnosis:  V58.83,V58.61,434.91,415.19, on  05/06/2010  Previous INR:  1.0 on  05/06/2010 Previous Coumadin Dose(mg):  2.5mg  qd on  05/06/2010 Previous Regimen:  5mg  qd on  05/06/2010  NEW REGIMEN & LAB RESULTS Current INR: 1.8 Current Coumadin Dose(mg): 5mg  qd Regimen: Same dose       Repeat testing in: 2 weeks MEDICATIONS CLONIDINE HCL 0.1 MG  TABS (CLONIDINE HCL) Take 1 tablet by mouth two times a day POTASSIUM CHLORIDE CRYS CR 20 MEQ CR-TABS (POTASSIUM CHLORIDE CRYS CR) Take 1 tablet by mouth once a day HYDROCODONE-ACETAMINOPHEN 5-500 MG TABS (HYDROCODONE-ACETAMINOPHEN) Take 1 tablet by mouth two times a day as needed PREDNISONE 5 MG  TABS (PREDNISONE) Take 1 tablet by mouth once a day ARAVA 20 MG TABS (LEFLUNOMIDE) Take 1 tablet by mouth once a day ANASTROZOLE 1 MG TABS (ANASTROZOLE) Take 1 tablet by mouth once a day ACTONEL 150 MG TABS (RISEDRONATE SODIUM) one on first day of month every month as directed ZADITOR 0.025 % SOLN (KETOTIFEN FUMARATE) once daily as directed * BLINK GEL TEARS as directed as needed WARFARIN SODIUM 2.5 MG TABS (WARFARIN SODIUM) one daily   Anticoagulation Visit Questionnaire      Coumadin dose missed/changed:  No      Abnormal Bleeding Symptoms:  No   Any diet changes including alcohol intake, vegetables or greens since the last visit:  No Any illnesses or hospitalizations since the last visit:  No Any signs of clotting since the last visit (including chest discomfort, dizziness, shortness of breath, arm tingling, slurred speech, swelling or redness in leg):  No

## 2011-01-16 NOTE — Assessment & Plan Note (Signed)
Summary: 6 wk rov//slm   Vital Signs:  Patient profile:   77 year old female Pulse rate:   68 / minute Pulse rhythm:   irregular BP sitting:   158 / 98  (left arm) Cuff size:   regular  Vitals Entered By: Kern Reap CMA Duncan Dull) (July 09, 2010 12:49 PM) CC: follow-up visit   CC:  follow-up visit.  History of Present Illness:  Follow-Up Visit      This is a 77 year old woman who presents for Follow-up visit.  The patient denies chest pain and palpitations.  Since the last visit the patient notes no new problems or concerns except has had some nausea the past week--she denies any new medications or unusual foods.  The patient reports taking meds as prescribed.  When questioned about possible medication side effects, the patient notes none.    All other systems reviewed and were negative   Current Problems (verified): 1)  Coumadin Therapy  (ICD-V58.61) 2)  Encounter For Therapeutic Drug Monitoring  (ICD-V58.83) 3)  Deep Venous Thrombophlebitis, Chronic  (ICD-453.40) 4)  Dependent Edema, Right Leg  (ICD-782.3) 5)  Unspecified Disorder of Kidney and Ureter  (ICD-593.9) 6)  Hyperlipidemia  (ICD-272.4) 7)  Neoplasm, Benign, Colon  (ICD-211.3) 8)  Iron Deficiency Anemia, Hx of  (ICD-V12.3) 9)  Bell's Palsy, Hx of  (ICD-V12.49) 10)  Cad  (ICD-414.00) 11)  Endocarditis  (ICD-424.90) 12)  Sleep Apnea  (ICD-780.57) 13)  Depression  (ICD-311) 14)  Anxiety  (ICD-300.00) 15)  CHF  (ICD-428.0) 16)  Gout, Hx of  (ICD-V12.2) 17)  Cva  (ICD-434.91) 18)  Pulmonary Embolism, Hx of  (ICD-V12.51) 19)  Rheumatoid Arthritis  (ICD-714.0) 20)  Hypertension  (ICD-401.9) 21)  Gerd  (ICD-530.81) 22)  Breast Cancer, Hx of  (ICD-V10.3)  Current Medications (verified): 1)  Clonidine Hcl 0.1 Mg  Tabs (Clonidine Hcl) .... Take 1 Tablet By Mouth Two Times A Day 2)  Potassium Chloride Crys Cr 20 Meq Cr-Tabs (Potassium Chloride Crys Cr) .... Take 1 Tablet By Mouth Once A Day 3)   Hydrocodone-Acetaminophen 5-500 Mg Tabs (Hydrocodone-Acetaminophen) .... Take 1 Tablet By Mouth Two Times A Day As Needed 4)  Prednisone 5 Mg Tabs (Prednisone) .... Take 1 Tablet By Mouth Once A Day 5)  Arava 20 Mg Tabs (Leflunomide) .... Take 1 Tablet By Mouth Once A Day 6)  Anastrozole 1 Mg Tabs (Anastrozole) .... Take 1 Tablet By Mouth Once A Day 7)  Actonel 150 Mg Tabs (Risedronate Sodium) .... One On First Day of Month Every Month As Directed 8)  Hydrochlorothiazide 25 Mg Tabs (Hydrochlorothiazide) .... Take 1 Tablet By Mouth Once A Day 9)  Gabapentin 300 Mg Caps (Gabapentin) .... Take 1 Tablet By Mouth At Bedtime 10)  Warfarin Sodium 5 Mg Tabs (Warfarin Sodium) .... Take 1 Tablet By Mouth Once A Day 11)  Fluoxetine Hcl 20 Mg Tabs (Fluoxetine Hcl) .... Take 1 Tablet By Mouth Once A Day  Allergies (verified): No Known Drug Allergies  Past History:  Past Medical History: Last updated: 05/02/2010 Breast cancer, hx of GERD Hypertension Rheumatoid arthritis Cerebrovascular accident, hx of reviewed chart---endocarditis---MRSA pulmonary emboli---?septic emboli Pulmonary Embolism, hx of Hyperlipidemia lower extremity deep vein thrombosis  Past Surgical History: Last updated: 03/06/2008 Partial Hysterectomy (1995) Tubaligation (1965) Rt Breast Lumpectomy; ca (2007) Tonsillectomy  Vein Stripping (1971) Right Rotator Cuff Repair (1986) Posterior Vaginal repair of rectocele (1610) Peg tube Placement (04/2002)  Family History: Last updated: 2007/09/03 father deceased-MI age 74 mother deceased  pancreatic CA 42 yo  Social History: Last updated: 08/09/2007 Retired Former Smoker Al Regular exercise-no Alcohol use-yes  Risk Factors: Alcohol Use: <1 (05/29/2010) Exercise: no (08/09/2007)  Risk Factors: Smoking Status: quit (05/29/2010)  Physical Exam  General:  alert and well-developed.   Head:  normocephalic and atraumatic.   Eyes:  pupils equal and pupils round.     Ears:  R ear normal and L ear normal.   Neck:  No deformities, masses, or tenderness noted. Chest Wall:  no deformities.   Lungs:  normal respiratory effort and no intercostal retractions.   Heart:  normal rate and regular rhythm.   Abdomen:  soft and non-tender.   Skin:  turgor normal and color normal.   Psych:  normally interactive and good eye contact.     Impression & Recommendations:  Problem # 1:  DEEP VENOUS THROMBOPHLEBITIS, CHRONIC (ICD-453.40)  she has no sxs continue current medications   Orders: Protime (16109UE)  Problem # 2:  HYPERLIPIDEMIA (ICD-272.4) note hdl Labs Reviewed: SGOT: 28 (06/19/2009)   SGPT: 18 (06/19/2009)   HDL:132.3 (12/22/2008), 123.1 (08/09/2007)  LDL:DEL (12/22/2008), DEL (08/09/2007)  Chol:323 (12/22/2008), 252 (08/09/2007)  Trig:79 (12/22/2008), 95 (08/09/2007)  Problem # 3:  HYPERTENSION (ICD-401.9) not controlled see meds advised to monitor at home goal bp < 135/85 Her updated medication list for this problem includes:    Clonidine Hcl 0.1 Mg Tabs (Clonidine hcl) .[name]... Take 1 tablet by mouth two times a day    Hydrochlorothiazide 25 Mg Tabs (Hydrochlorothiazide) .[name]... Take 1 tablet by mouth once a day  BP today: 158/98 Prior BP: 136/84 (05/29/2010)  Labs Reviewed: K+: 3.2 (06/19/2009) Creat: : 1.0 (06/22/2009)   Chol: 323 (12/22/2008)   HDL: 132.3 (12/22/2008)   LDL: DEL (12/22/2008)   TG: 79 (12/22/2008)  Problem # 4:  CAD (ICD-414.00)  no sxs continue current medications  Her updated medication list for this problem includes:    Clonidine Hcl 0.1 Mg Tabs (Clonidine hcl) .[name]... Take 1 tablet by mouth two times a day    Hydrochlorothiazide 25 Mg Tabs (Hydrochlorothiazide) .[name]... Take 1 tablet by mouth once a day  Labs Reviewed: Chol: 323 (12/22/2008)   HDL: 132.3 (12/22/2008)   LDL: DEL (12/22/2008)   TG: 79 (12/22/2008)  Complete Medication List: 1)  Clonidine Hcl 0.1 Mg Tabs (Clonidine hcl) .... Take 1 tablet by mouth two  times a day 2)  Potassium Chloride Crys Cr 20 Meq Cr-tabs (Potassium chloride crys cr) .... Take 1 tablet by mouth once a day 3)  Hydrocodone-acetaminophen 5-500 Mg Tabs (Hydrocodone-acetaminophen) .... Take 1 tablet by mouth two times a day as needed 4)  Prednisone 5 Mg Tabs (Prednisone) .... Take 1 tablet by mouth once a day 5)  Arava 20 Mg Tabs (Leflunomide) .... Take 1 tablet by mouth once a day 6)  Anastrozole 1 Mg Tabs (Anastrozole) .... Take 1 tablet by mouth once a day 7)  Actonel 150 Mg Tabs (Risedronate sodium) .... One on first day of month every month as directed 8)  Hydrochlorothiazide 25 Mg Tabs (Hydrochlorothiazide) .... Take 1 tablet by mouth once a day 9)  Gabapentin 300 Mg Caps (Gabapentin) .... Take 1 tablet by mouth at bedtime 10)  Warfarin Sodium 5 Mg Tabs (Warfarin sodium) .... Take 1 tablet by mouth once a day 11)  Fluoxetine Hcl 20 Mg Tabs (Fluoxetine hcl) .... Take 1 tablet by mouth once a day  Other Orders: Venipuncture (45409) Specimen Handling (81191) TLB-BMP (Basic Metabolic Panel-BMET) (80048-METABOL) TLB-CBC  Platelet - w/Differential (85025-CBCD) TLB-Hepatic/Liver Function Pnl (80076-HEPATIC)  Laboratory Results   Blood Tests   Date/Time Recieved: July 09, 2010 1:33 PM  Date/Time Reported: July 09, 2010 1:33 PM    INR: 4.0   (Normal Range: 0.88-1.12   Therap INR: 2.0-3.5) Comments: Wynona Canes, CMA  July 09, 2010 1:33 PM       ANTICOAGULATION RECORD PREVIOUS REGIMEN & LAB RESULTS Anticoagulation Diagnosis:  V58.83,V58.61,434.91,415.19, on  05/06/2010  Previous INR:  2.6 on  07/04/2010 Previous Coumadin Dose(mg):  2.5mg  qd on  07/04/2010 Previous Regimen:  2.5mg  qd on  07/04/2010 Previous Coagulation Comments:  2.5mg  qd by patient's son on  07/04/2010  NEW REGIMEN & LAB RESULTS Current INR: 4.0 Current Coumadin Dose(mg): 5mg  qd Regimen: 2.5mg  qd  (no change) Coagulation Comments: Patient took 5mg  qd nstead of 2.5mg  qd, Dr. Lovell Sheehan  told patient to take 2.5mg  once daily and check in 2 weeks      Repeat testing in: 2 wweks MEDICATIONS CLONIDINE HCL 0.1 MG  TABS (CLONIDINE HCL) Take 1 tablet by mouth two times a day POTASSIUM CHLORIDE CRYS CR 20 MEQ CR-TABS (POTASSIUM CHLORIDE CRYS CR) Take 1 tablet by mouth once a day HYDROCODONE-ACETAMINOPHEN 5-500 MG TABS (HYDROCODONE-ACETAMINOPHEN) Take 1 tablet by mouth two times a day as needed PREDNISONE 5 MG TABS (PREDNISONE) Take 1 tablet by mouth once a day ARAVA 20 MG TABS (LEFLUNOMIDE) Take 1 tablet by mouth once a day ANASTROZOLE 1 MG TABS (ANASTROZOLE) Take 1 tablet by mouth once a day ACTONEL 150 MG TABS (RISEDRONATE SODIUM) one on first day of month every month as directed HYDROCHLOROTHIAZIDE 25 MG TABS (HYDROCHLOROTHIAZIDE) Take 1 tablet by mouth once a day GABAPENTIN 300 MG CAPS (GABAPENTIN) Take 1 tablet by mouth at bedtime WARFARIN SODIUM 5 MG TABS (WARFARIN SODIUM) Take 1 tablet by mouth once a day FLUOXETINE HCL 20 MG TABS (FLUOXETINE HCL) Take 1 tablet by mouth once a day

## 2011-01-16 NOTE — Assessment & Plan Note (Signed)
Summary: PT // RS   Nurse Visit   Allergies: No Known Drug Allergies Laboratory Results   Blood Tests      INR: 1.4   (Normal Range: 0.88-1.12   Therap INR: 2.0-3.5) Comments: Alyssa Waters  October 02, 2010 12:06 PM     Orders Added: 1)  Est. Patient Level I [99211] 2)  Protime [86578IO]   ANTICOAGULATION RECORD PREVIOUS REGIMEN & LAB RESULTS Anticoagulation Diagnosis:  V58.83,V58.61,434.91,415.19, on  05/06/2010  Previous INR:  1.3 on  09/23/2010 Previous Coumadin Dose(mg):  2.5mg  , None Altinate on  09/23/2010 Previous Regimen:  2.5mg  qd on  09/23/2010 Previous Coagulation Comments:  Patient took 5mg  qd nstead of 2.5mg  qd, Dr. Lovell Sheehan told patient to take 2.5mg  once daily and check in 2 weeks on  07/09/2010  NEW REGIMEN & LAB RESULTS Current INR: 1.4 Regimen: 2.5mg  and 5mg . ALT  Repeat testing in: 1 week  Anticoagulation Visit Questionnaire Coumadin dose missed/changed:  No Abnormal Bleeding Symptoms:  No  Any diet changes including alcohol intake, vegetables or greens since the last visit:  No Any illnesses or hospitalizations since the last visit:  No Any signs of clotting since the last visit (including chest discomfort, dizziness, shortness of breath, arm tingling, slurred speech, swelling or redness in leg):  Yes  MEDICATIONS CLONIDINE HCL 0.1 MG  TABS (CLONIDINE HCL) Take 1 tablet by mouth two times a day POTASSIUM CHLORIDE CRYS CR 20 MEQ CR-TABS (POTASSIUM CHLORIDE CRYS CR) Take 1 tablet by mouth once a day HYDROCODONE-ACETAMINOPHEN 5-500 MG TABS (HYDROCODONE-ACETAMINOPHEN) Take 1 tablet by mouth two times a day as needed PREDNISONE 5 MG TABS (PREDNISONE) Take 1 tablet by mouth once a day ARAVA 20 MG TABS (LEFLUNOMIDE) Take 1 tablet by mouth once a day ANASTROZOLE 1 MG TABS (ANASTROZOLE) Take 1 tablet by mouth once a day ACTONEL 150 MG TABS (RISEDRONATE SODIUM) one on first day of month every month as directed HYDROCHLOROTHIAZIDE 25 MG TABS  (HYDROCHLOROTHIAZIDE) Take 1 tablet by mouth once a day GABAPENTIN 300 MG CAPS (GABAPENTIN) Take 1 tablet by mouth at bedtime WARFARIN SODIUM 5 MG TABS (WARFARIN SODIUM) Take 1 tablet by mouth once a day FLUOXETINE HCL 20 MG TABS (FLUOXETINE HCL) Take 1 tablet by mouth once a day

## 2011-01-16 NOTE — Progress Notes (Signed)
Summary: Not Feeling Well  Phone Note Call from Patient Call back at Home Phone [phone]   Caller: Joice Lofts @ [facility] Summary of Call: pt seen for eye exam this morning stated she was not feeling well and leg was slightly swollen, what should she do? Initial call taken by: Trixie Dredge,  Apr 26, 2010 11:28 AM  Follow-up for Phone Call        Spoke w/ Dr. Cato Mulligan advised to tell patient to go home our office will call to schedule an appt for Monday Follow-up by: Trixie Dredge,  Apr 26, 2010 11:30 AM  Additional Follow-up for Phone Call Additional follow up Details #1::        Spoke w/ pt feeling better leg still swollen scheduled appt for Monday 04/29/10 @ 11:15am Additional Follow-up by: Trixie Dredge,  Apr 26, 2010 2:08 PM

## 2011-01-16 NOTE — Assessment & Plan Note (Signed)
Summary: pt/njr   Nurse Visit   Allergies: No Known Drug Allergies Laboratory Results   Blood Tests   Date/Time Received: May 06, 2010 2:07 PM  Date/Time Reported: May 06, 2010 2:07 PM   PT: 12.4 s   (Normal Range: 10.6-13.4)  INR: 1.0   (Normal Range: 0.88-1.12   Therap INR: 2.0-3.5) Comments: Wynona Canes, CMA  May 06, 2010 2:07 PM     Orders Added: 1)  Est. Patient Level I [99211] 2)  Protime [16109UE]  Laboratory Results   Blood Tests     PT: 12.4 s   (Normal Range: 10.6-13.4)  INR: 1.0   (Normal Range: 0.88-1.12   Therap INR: 2.0-3.5) Comments: Wynona Canes, CMA  May 06, 2010 2:07 PM       ANTICOAGULATION RECORD       NEW REGIMEN & LAB RESULTS Anticoag. Dx: V58.83,V58.61,434.91,415.19, Current INR: 1.0 Current Coumadin Dose(mg): 2.5mg  qd Regimen: 5mg  qd       Repeat testing in: Friday MEDICATIONS CLONIDINE HCL 0.1 MG  TABS (CLONIDINE HCL) Take 1 tablet by mouth two times a day POTASSIUM CHLORIDE CRYS CR 20 MEQ CR-TABS (POTASSIUM CHLORIDE CRYS CR) Take 1 tablet by mouth once a day HYDROCODONE-ACETAMINOPHEN 5-500 MG TABS (HYDROCODONE-ACETAMINOPHEN) Take 1 tablet by mouth two times a day as needed PREDNISONE 5 MG TABS (PREDNISONE) Take 1 tablet by mouth once a day ARAVA 20 MG TABS (LEFLUNOMIDE) Take 1 tablet by mouth once a day ANASTROZOLE 1 MG TABS (ANASTROZOLE) Take 1 tablet by mouth once a day ACTONEL 150 MG TABS (RISEDRONATE SODIUM) one on first day of month every month as directed ZADITOR 0.025 % SOLN (KETOTIFEN FUMARATE) once daily as directed * BLINK GEL TEARS as directed as needed WARFARIN SODIUM 2.5 MG TABS (WARFARIN SODIUM) one daily

## 2011-01-16 NOTE — Progress Notes (Signed)
Summary: blood clot result  Phone Note Call from Patient   Complaint: Urinary/GYN Problems Summary of Call: patient is calling for result and or treatment plan for blood clot  Follow-up for Phone Call        doppler pending if positive will need ov to get luvenox and start coumadin or may have other oprder per dr swords...[name] per Dr Lovell Sheehan Follow-up by: Willy Eddy, LPN,  May 02, 2010 11:28 AM  Additional Follow-up for Phone Call Additional follow up Details #1::        appointment with dr Kirtland Bouchard this pm at 3;15 Additional Follow-up by: Willy Eddy, LPN,  May 02, 2010 11:42 AM     Appended Document: blood clot result son, Leonette Most was notified that she did have a blood clot and has ov with dr Kirtland Bouchard at 3pm today for tx- he stated he couldnt bring her, to call his sister fonda petty. [phone] no answer left message on machine to call here. work number had no answer left message on machine  to call here. [phone]/ per dr Lovell Sheehan if no one can bring her today they will have to call [phone] and have her sent to ed for tx/  this was explained to son and son said he would try to get her here today. address and all information given to son.  He was instructed to call back if he was unable to keep appointment and ask for debby and she would give him instruction about taking her to er.    Appended Document: blood clot result Pt's daughter called to confirm she will be here today for Dr. Charm Rings appt.

## 2011-01-16 NOTE — Assessment & Plan Note (Signed)
Summary: f/u doppler- dr swords pt-   and he a we will  Vital Signs:  Patient profile:   77 year old female Weight:      127 pounds Temp:     98.1 degrees F oral BP sitting:   120 / 70  (left arm) Cuff size:   regular  Vitals Entered By: Duard Brady LPN (May 02, 2010 3:35 PM) CC: f/u on doppler Is Patient Diabetic? No   CC:  f/u on doppler.  History of Present Illness: 77 year old patient who is seen today for follow-up.  She was seen earlier complaining of leg pain and a D. dimer was elevated.  The patient suddenly underwent lower extremity venous Doppler evaluation that revealed bilateral chronic partially occluding thrombi in the bilateral superficial femoral vein, popliteal vein, and right  posterior tibial vein.  There is felt to be a more acute thrombus in the left gastrocnemius veins.  Today, the patient complains of pain in the right medial ankle and also pain in the left medial thigh.  She denies any pulmonary complaints.  She has no known prior history of DVT. Medical problems include congestive heart failure, gout, and triple vascular disease, which have been stable.  She apparently also has a prior history of pulmonary emboli-maybe septic emboli from endocarditis  Allergies: No Known Drug Allergies  Past History:  Past Medical History: Breast cancer, hx of GERD Hypertension Rheumatoid arthritis Cerebrovascular accident, hx of reviewed chart---endocarditis---MRSA pulmonary emboli---?septic emboli Pulmonary Embolism, hx of Hyperlipidemia lower extremity deep vein thrombosis  Past Surgical History: Reviewed history from 03/06/2008 and no changes required. Partial Hysterectomy (1995) Tubaligation (1965) Rt Breast Lumpectomy; ca (2007) Tonsillectomy  Vein Stripping (1971) Right Rotator Cuff Repair (1986) Posterior Vaginal repair of rectocele (5409) Peg tube Placement (04/2002)  Review of Systems       The patient complains of muscle weakness and  difficulty walking.  The patient denies anorexia, fever, weight loss, weight gain, vision loss, decreased hearing, hoarseness, chest pain, syncope, dyspnea on exertion, peripheral edema, prolonged cough, headaches, hemoptysis, abdominal pain, melena, hematochezia, severe indigestion/heartburn, hematuria, incontinence, genital sores, suspicious skin lesions, transient blindness, depression, unusual weight change, abnormal bleeding, enlarged lymph nodes, angioedema, and breast masses.    Physical Exam  General:  Well-developed,well-nourished,in no acute distress; alert,appropriate and cooperative throughout examination Neck:  No deformities, masses, or tenderness noted. Lungs:  Normal respiratory effort, chest expands symmetrically. Lungs are clear to auscultation, no crackles or wheezes. Heart:  Normal rate and regular rhythm. S1 and S2 normal without gallop, murmur, click, rub or other extra sounds. Msk:  slight tenderness over the right medial ankle region.    No leg tenderness Extremities:  slight edema about the right ankle   Impression & Recommendations:  Problem # 1:  DEEP VENOUS THROMBOPHLEBITIS, CHRONIC (ICD-453.40)  Problem # 2:  DEPENDENT EDEMA, RIGHT LEG (ICD-782.3)  Problem # 3:  CHF (ICD-428.0)  Her updated medication list for this problem includes:    Warfarin Sodium 2.5 Mg Tabs (Warfarin sodium) ..... One daily  Complete Medication List: 1)  Clonidine Hcl 0.1 Mg Tabs (Clonidine hcl) .... Take 1 tablet by mouth two times a day 2)  Potassium Chloride Crys Cr 20 Meq Cr-tabs (Potassium chloride crys cr) .... Take 1 tablet by mouth once a day 3)  Hydrocodone-acetaminophen 5-500 Mg Tabs (Hydrocodone-acetaminophen) .... Take 1 tablet by mouth two times a day as needed 4)  Prednisone 5 Mg Tabs (Prednisone) .... Take 1 tablet by mouth once a day  5)  Arava 20 Mg Tabs (Leflunomide) .... Take 1 tablet by mouth once a day 6)  Anastrozole 1 Mg Tabs (Anastrozole) .... Take 1 tablet by  mouth once a day 7)  Actonel 150 Mg Tabs (Risedronate sodium) .... One on first day of month every month as directed 8)  Zaditor 0.025 % Soln (Ketotifen fumarate) .... Once daily as directed 9)  Blink Gel Tears  .... As directed as needed 10)  Warfarin Sodium 2.5 Mg Tabs (Warfarin sodium) .... One daily  Patient Instructions: 1)  return in in 4 days for a blood test (INR) 2)  Lovenox 40 mg subcutaneous daily for 5 days 3)  Coumadin 2.5 mg daily 4)  Call if any worsening pain, shortness of breath or swelling Prescriptions: WARFARIN SODIUM 2.5 MG TABS (WARFARIN SODIUM) one daily  #30 x 0   Entered and Authorized by:   Gordy Savers  MD   Signed by:   Gordy Savers  MD on 05/02/2010   Method used:   Electronically to        [facility] [address] (retail)       [address]       [place], [place]  [id number]       Ph: [phone]       Fax: [phone]   RxID:   [id number]

## 2011-01-16 NOTE — Letter (Signed)
[facility]   Imported By: Maryln Gottron 04/12/2010 12:49:52  _____________________________________________________________________  External Attachment:    Type:   Image     Comment:   External Document

## 2011-01-16 NOTE — Miscellaneous (Signed)
Summary: Orders Update  Clinical Lists Changes  Problems: Added new problem of DEPENDENT EDEMA, RIGHT LEG (ICD-782.3) Orders: Added new Test order of Venous Duplex Lower Extremity (Venous Duplex Lower) - Signed

## 2011-01-16 NOTE — Progress Notes (Signed)
Summary: REFILL REQUEST (Warfarin)  Phone Note Refill Request Message from:  Patient on May 29, 2010 1:28 PM  Refills Requested: Medication #1:  WARFARIN SODIUM 2.5 MG TABLET   Notes: [facility], [employer].  Pts son Beverely Pace) req that Rx be sent in today... adv that Dr Kirtland Bouchard only sent in Rx for limited amt and pt was adv to f/u with Dr Cato Mulligan to obtain remaining amount.   Initial call taken by: Debbra Riding,  May 29, 2010 1:31 PM  Follow-up for Phone Call        see Rx in ov notes from 05/29/10.  Son notified of dose change of pill to 5mg . Follow-up by: Gladis Riffle, RN,  May 29, 2010 3:30 PM

## 2011-01-16 NOTE — Progress Notes (Signed)
Summary: wants med reinstated.  Phone Note Call from Patient Call back at Home Phone [phone]   Caller: Patient live phone call Call For: Alyssa Waters Summary of Call: Wants to know why fluoxetine denied.  Note says was DCD by Waters on 04/29/10.  Would like it resumed at 20mg  once daily to [facility].  Call when done. Initial call taken by: Gladis Riffle, RN,  June 21, 2010 10:03 AM  Follow-up for Phone Call        ok to resume Follow-up by: Alyssa Waters,  June 21, 2010 11:06 AM  Additional Follow-up for Phone Call Additional follow up Details #1::        see Rx.Patient notified.  Additional Follow-up by: Gladis Riffle, RN,  June 21, 2010 3:10 PM    New/Updated Medications: FLUOXETINE HCL 20 MG TABS (FLUOXETINE HCL) Take 1 tablet by mouth once a day Prescriptions: FLUOXETINE HCL 20 MG TABS (FLUOXETINE HCL) Take 1 tablet by mouth once a day  #30 x 2   Entered by:   Gladis Riffle, RN   Authorized by:   Alyssa Waters   Signed by:   Gladis Riffle, RN on 06/21/2010   Method used:   Electronically to        [facility] [address] (retail)       [address]       [place], [place]  [id number]       Ph: [phone]       Fax: [phone]   RxID:   [id number]

## 2011-01-16 NOTE — Progress Notes (Signed)
Summary: arava interaction sw pt  Phone Note From Other Clinic   Caller: Dr. [phone] Summary of Call: Wants Dr. Cato Mulligan to know that he has patient on Arava 20mg  daily & that it interacts with coumadin & can increase the INR.   Initial call taken by: Rudy Jew, RN,  July 02, 2010 1:15 PM  Follow-up for Phone Call        per dr Lovell Sheehan-  hold for dr Fiora Weill   Follow-up by: Willy Eddy, LPN,  July 02, 2010 1:46 PM  Additional Follow-up for Phone Call Additional follow up Details #1::        schedule routine f/u Additional Follow-up by: Birdie Sons MD,  July 02, 2010 2:20 PM    Additional Follow-up for Phone Call Additional follow up Details #2::    Already has ov Dr. Armanda Magic 7-26 & gets lab this Th also per son.  Follow-up by: Rudy Jew, RN,  July 02, 2010 2:54 PM

## 2011-01-16 NOTE — Assessment & Plan Note (Signed)
Summary: pt//slm   Nurse Visit   Allergies: No Known Drug Allergies Laboratory Results   Blood Tests      INR: 2.1   (Normal Range: 0.88-1.12   Therap INR: 2.0-3.5) Comments: Rita Ohara  June 05, 2010 11:28 AM     Orders Added: 1)  Est. Patient Level I [99211] 2)  Protime [16109UE]   ANTICOAGULATION RECORD PREVIOUS REGIMEN & LAB RESULTS Anticoagulation Diagnosis:  V58.83,V58.61,434.91,415.19, on  05/06/2010  Previous INR:  1.8 on  05/10/2010 Previous Coumadin Dose(mg):  5mg  qd on  05/10/2010 Previous Regimen:  Same dose on  05/10/2010  NEW REGIMEN & LAB RESULTS Current INR: 2.1 Regimen: same  Repeat testing in: July 5th  Anticoagulation Visit Questionnaire Coumadin dose missed/changed:  No Abnormal Bleeding Symptoms:  No  Any diet changes including alcohol intake, vegetables or greens since the last visit:  No Any illnesses or hospitalizations since the last visit:  No Any signs of clotting since the last visit (including chest discomfort, dizziness, shortness of breath, arm tingling, slurred speech, swelling or redness in leg):  No  MEDICATIONS CLONIDINE HCL 0.1 MG  TABS (CLONIDINE HCL) Take 1 tablet by mouth two times a day POTASSIUM CHLORIDE CRYS CR 20 MEQ CR-TABS (POTASSIUM CHLORIDE CRYS CR) Take 1 tablet by mouth once a day HYDROCODONE-ACETAMINOPHEN 5-500 MG TABS (HYDROCODONE-ACETAMINOPHEN) Take 1 tablet by mouth two times a day as needed PREDNISONE 5 MG TABS (PREDNISONE) Take 1 tablet by mouth once a day ARAVA 20 MG TABS (LEFLUNOMIDE) Take 1 tablet by mouth once a day ANASTROZOLE 1 MG TABS (ANASTROZOLE) Take 1 tablet by mouth once a day ACTONEL 150 MG TABS (RISEDRONATE SODIUM) one on first day of month every month as directed HYDROCHLOROTHIAZIDE 25 MG TABS (HYDROCHLOROTHIAZIDE) Take 1 tablet by mouth once a day GABAPENTIN 300 MG CAPS (GABAPENTIN) Take 1 tablet by mouth at bedtime WARFARIN SODIUM 5 MG TABS (WARFARIN SODIUM) Take 1 tablet by mouth once  a day

## 2011-01-17 ENCOUNTER — Other Ambulatory Visit: Payer: Self-pay | Admitting: Internal Medicine

## 2011-01-17 ENCOUNTER — Encounter: Payer: Self-pay | Admitting: Internal Medicine

## 2011-01-17 DIAGNOSIS — I10 Essential (primary) hypertension: Secondary | ICD-10-CM

## 2011-02-14 ENCOUNTER — Other Ambulatory Visit: Payer: Self-pay | Admitting: Internal Medicine

## 2011-03-24 ENCOUNTER — Other Ambulatory Visit: Payer: Self-pay | Admitting: Internal Medicine

## 2011-03-26 ENCOUNTER — Other Ambulatory Visit: Payer: Self-pay | Admitting: Internal Medicine

## 2011-04-12 ENCOUNTER — Other Ambulatory Visit: Payer: Self-pay | Admitting: Internal Medicine

## 2011-04-16 NOTE — Telephone Encounter (Signed)
Pt is out of clonidine please call [facility] [phone]

## 2011-04-17 MED ORDER — CLONIDINE HCL 0.1 MG PO TABS: 0.1000 mg | ORAL_TABLET | Freq: Two times a day (BID) | ORAL | Status: DC

## 2011-04-17 NOTE — Telephone Encounter (Signed)
Pt made appt, sent in rx electronically

## 2011-04-29 NOTE — Assessment & Plan Note (Signed)
[facility]                         GASTROENTEROLOGY OFFICE NOTE   NAME:WILLIAMSAlyona, Romack                     MRN:          [id number]  DATE:08/30/2007                            DOB:          [birth year]    CHIEF COMPLAINT:  Followup of colon polyps.   PROBLEMS:  1. Rheumatoid arthritis.  2. Breast cancer, status post what sounds like lumpectomy, on Arimidex      (followed by Dr. Darnelle Catalan).  3. History of stroke and gastrostomy tube.  4. Incomplete colonoscopy with 3 mm adenomatous polyp on the left      side, subsequent barium enema air contrast negative, this was      September 2005.  5. Chronically low weight.  6. 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  7. Carotid artery disease.  8. History of iron deficiency anemia.  9. Hypertension.  10.Recurrent epistaxis.   INTERVAL HISTORY:  Breast cancer developed in the interim since I saw  her in 2005. I had brought her into the office just to review the  possible need for a repeat colonoscopy. She still has problems with  recurrent epistaxis and constipation when she takes her methotrexate but  not at other times. She has had some vague chest pain problems with a  negative stress test that is thought to be atypical. She seems  relatively stable but says she can not gain weight, she is 8 pounds more  than she weighed in 2005.   PHYSICAL EXAMINATION:  She is obviously significantly chronically ill,  with some sort of a gaunt appearance. Weight is 115 pounds, blood  pressure 126/80, pulse 80. Speech is relatively clear but not always  understandable without repetition.  LUNGS: Clear.  HEART: S1, S2. No rubs or gallops.  ABDOMEN: Soft, nontender.  LOWER EXTREMITIES: Free of edema.  MUSCULOSKELETAL: She has marked rheumatoid deformities of the hands and  wrists.   ASSESSMENT:  Personal history of colon polyps. This lady had an  incomplete colonoscopy with a 3 mm adenomatous  polyp on the left side  and a negative air contrast barium enema to clear the colon in 2005. I  think that her cor morbidities preclude having a colonoscopy now, we can  reconsider that in 2 years perhaps a CT colonoscopy would be an option,  but it may be reasonable to stop screening her due to her cor  morbidities. I am concerned that perforation from an optical colonoscopy  (or perhaps even a virtual colonoscopy) would be a real disaster for her  and something she would not survive and I do not think in her situation,  i.e., the risk/ benefit ratio does not favor benefit at this time.  Hemoccults could be considered but with her recurrent epistaxis I think  the risk of a false positive goes up even more than usual.   PLAN:  We will reconsider surveillance colonoscopy but wait 2 years,  that will be 5 years from the last exam. A single 3 mm left-sided  adenoma does not necessarily indicate an increase risk for polyps in the  future.     Baldo Ash  Sena Slate, MD,FACG  Electronically Signed    CEG/MedQ  DD: 08/30/2007  DT: 08/30/2007  Job #: [id number]   cc:   Valentino Hue. Magrinat, M.D.  Bruce Rexene Edison Swords, MD  Aundra Dubin, M.D.

## 2011-05-02 NOTE — Consult Note (Signed)
NAME:  Alyssa, Waters                        ACCOUNT NO.:  [id number]   MEDICAL RECORD NO.:  [id number]                   PATIENT TYPE:  INP   LOCATION:  5741                                 FACILITY:  [facility]   PHYSICIAN:  Casimiro Needle L. Thad Ranger, M.D.           DATE OF BIRTH:  11/26/1931   DATE OF CONSULTATION:  04/12/2004  DATE OF DISCHARGE:                                   CONSULTATION   REQUESTING PHYSICIAN:  Wanda Plump, M.D.   REASON FOR EVALUATION:  Stroke and carotid disease.   HISTORY OF PRESENT ILLNESS:  This is the initial inpatient consultation  evaluation of this 77 year old woman with past medical history including  rheumatoid arthritis with chronic immunosuppression as well as hypertension.  The patient was admitted on April 25 after experiencing increasing pain and  drainage from a perineal wound over the previous four days. In the emergency  room, she was febrile and had a significant leukocytosis. She was taken to  the OR and underwent incision and drainage of the complex abscess by Dr.  Luan Pulling with subsequent packing, and she is presently undergoing wound  care for this. She was initially placed on Primaxin which was changed to  vancomycin once cultures grew out methicillin-resistant staph aureus. The  patient was noted to be dysarthric on admission and apparently her family  reports that she had became dysarthric two days ago. There was concern that  she might have had a stroke. The patient states that this began acute last  Tuesday. It is unclear to me what if any workup was undertaken at that time.  In any case, during hospitalization, she underwent noncontrast CT of the  head which demonstrated findings of a subacute stroke in the right  frontoparietal area and at least part of the middle cerebral artery  territory. Stroke work indicated bilateral carotid disease with 46% stenosis  of the left and at least 46% stenosis on the right, estimated to be higher  due to some technical factors. During the hospital stay, she has remained  febrile on antibiotics. She was also found to have pulmonary abscesses and  was seen today by infectious disease. She has had significant dysphagia in  the hospital, actually is presently NPO on the recommendation of speech  therapy and has undergone Panda tube placement with consideration of PEG  tube. She has also had a decrease in hemoglobin in the hospital with workup  demonstrating significant iron deficiency. Neurological consultation is  requested on the patient's fifth hospital day for consideration of  intervention on the carotid artery. The patient presently is complaining of  pain in the hands which has been going on for a few days. This is due to  obvious swelling and inflammation of the joints of both hands. She also  complained about not being able to swallow.   PAST MEDICAL HISTORY:  As noted above. She has also been on Zoloft in the  past for depression and takes hydrochlorothiazide for hypertension. She says  she had a mini stroke about a year ago which left her weak on the left  side of her face, but according to the chart, this was actually Bell's  palsy.   FAMILY/SOCIAL/REVIEW OF SYSTEMS:  As outlined in the HPI by Dr. Luan Pulling  and Dr. Felicity Coyer.   MEDICATIONS:  Prior to admission, she was taking:  1. Hydrochlorothiazide.  2. Prednisone.  3. Methotrexate.  4. Naprosyn.  5. Sular.  6. Premarin.  7. Folate.  8. Calcium.  9. Vitamin E.  10.      Clarinex.  11.      Potassium.  12.      Fosamax.   Presently in the hospital, she is off of the methotrexate and prednisone,  receiving Duragesic patch, Lovenox, Protonix, albuterol inhalers,  intravenous vancomycin with Jevity tube feeds. Aspirin ordered April 26 is  presently on hold.   PHYSICAL EXAMINATION:  VITAL SIGNS:  Temperature 100.5, blood pressure  155/57, pulse 115, respirations 18, O2 saturation 93% on 2 liters.  GENERAL:   This is an ill appearing woman supine in the hospital bed in no  evident distress.  HEENT:  Head:  Cranium is normocephalic, atraumatic. Oropharynx is benign.  NECK:  Supple without carotid bruits.  HEART:  Tachycardia. Regular rhythm. No murmurs.  NEUROLOGICAL:  Mental status. She is somewhat drowsy, alerts to voice but  tends to fall asleep. She is, however, oriented to [facility] and  to the month and year. She can follow one and two step commands. Her speech  is moderately to severely dysarthric, but no definite aphasia is noted.  Cranial nerves:  Fundi were poorly visualized. Pupils are equal and briskly  reactive. Extraocular movements are full without nystagmus. She does not  seem to see off to the left very well. Does not blink to threat from that  side. She turns head to either side to noise. Facial sensation is intact to  light touch. There is definite right facial droop with upper and lower face  weakness. Tongue and palate move well. Shoulder shrug strength is full.  Motor testing:  Decreased bulk, normal tongue throughout. Extremity power  testing is greatly limited due to diffuse pain related to markedly inflamed  joints, particularly in the hands. She had totally withstanding gravity  strength in all muscles. Sensation:  She appreciates light touch sensation  in all extremities. Coordination tasks  cannot be performed due to problems  in the hands and in the joints of the lower extremities. Likewise, she is  unable to ambulate. Reflexes are 2+ and symmetric. Toe is neutral on the  right and upgoing on the left.   LABORATORY DATA:  CBC:  White count 25.2, hemoglobin 8.7, platelets 964,000.  CMP on April 27 remarkable for elevated glucose of 115, elevated alkaline  phosphatase of 303, low total protein and albumin of 5.1 and 1.3  respectively. Cultures have grow out methicillin-resistant staph aureus. CT of the head performed April 12, 2004 is personally reviewed and  demonstrates  a clearly subacute stroke in the right parietal area which is a large vessel  stroke involving frontoparietal area of the MCA territory with local  swelling but no hemorrhage and no midline shift.   IMPRESSION:  1. Subacute (probably 10-day old) right parietal cortical middle cerebral     artery branch infarct with left facial weakness, dysarthria, and visual     neglect.  2. Right greater  than left carotid disease, possibly occluded on the left.  3. Disseminated methicillin-resistant staph aureus infection with lung     abscesses, multiple joint injections and recent incision and drainage of     a large buttock abscess. This is resulting in an impressive ongoing acute     phase reaction with thrombocytosis.  4. Decreasing hemoglobin and hematocrit, question gastrointestinal bleed.   RECOMMENDATIONS:  Aspirin could be considered for stroke prophylaxis but  only when:  1) Gastrointestinal bleed is ruled out.  2) Surgeons are  agreeable.  I agree with __________ recommendation of a transesophageal  echocardiogram as, given the dissemination and the MRSA, is quite likely she  has endocarditis, and as long as there is question of septic emboli,  anticoagulation would be a very bad idea. She is obviously a very poor  candidate for  carotid endarterectomy; stenting of the right carotid could be considered,  but presently I think she is too sick for even that and certainly would not  be in any hurry to proceed along those lines.   Thank you for the consultation. The stroke service will follow.                                               Michael L. Thad Ranger, M.D.    MLR/MEDQ  D:  04/12/2004  T:  04/12/2004  Job:  [id number]   cc:   Gordy Savers, M.D. [facility]   Vikki Ports, M.D.  [address]  [place]  [place] [address]  Fax: [phone]   Lacretia Leigh. Ninetta Lights, M.D.  [address]  [place]  [place] [address]  Fax: [phone]

## 2011-05-02 NOTE — Discharge Summary (Signed)
NAME:  Alyssa Waters, Alyssa Waters                        ACCOUNT NO.:  [id number]   MEDICAL RECORD NO.:  [id number]                   PATIENT TYPE:  INP   LOCATION:  5741                                 FACILITY:  [facility]   PHYSICIAN:  Rene Paci, M.D. [facility]          DATE OF BIRTH:  1931/07/26   DATE OF ADMISSION:  04/08/2004  DATE OF DISCHARGE:  04/23/2004                           DISCHARGE SUMMARY - REFERRING   DISCHARGE DIAGNOSES:  1. Disseminated community acquired methicillin resistant Staphylococcus     aureus.  2. Perineal abscess.  3. Septic pulmonary emboli and pulmonary abscess.  4. Septic joint.  5. Endocarditis.  6. Right middle cerebral artery  cerebrovascular accident.  7. Thrombocytosis.  8. Anemia.  9. Dysphagia.  10.      Hypertension.  11.      Iron-deficiency anemia.   HISTORY OF PRESENT ILLNESS:  Alyssa Waters is a 77 year old African American  female who presented with four-day history of increasing pain and drainage  from a peroneal wound.  She described the pain as being a 7/10.  She was  brought to the emergency room and evaluated by surgery.  The patient was  felt to have a large peroneal abscess and required admission for I&D.   PAST MEDICAL HISTORY:  1. History of Bell's palsy.  2. Diet controlled diabetes, perhaps steroid induced.  3. Hypertension.  4. Depression.  5. Rheumatoid arthritis, on prednisone and methotrexate.   HOSPITAL COURSE:  PROBLEM #1 -  INFECTIOUS DISEASE:  The patient initially  presented with peroneal abscess.  The patient was seen in consultation by  Dr. Luan Pulling.  She did perform an I&D on April 08, 2004.  The patient was  empirically started on Primaxin.  Of note, the patient had also an unusual  chest x-ray. This was followed up by CT of the chest.  This was consistent  with multiple cavitary lesions consistent with abscess.  The peroneal  abscess was cultured and did grow MRSA.  This did prompt an infectious  disease consult.   At the same time her antibiotics were changed to  vancomycin.  Infectious disease's suspicion was that she had a disseminated  community acquired MRSA first diagnosed with abscess associated probably  with bacteremia although blood cultures were negative when he did check her  blood cultures.  Joint seating and septic pulmonary emboli leading to cavity  pneumonia.  We were also concerned about endocarditis.  TEE was scheduled  and did confirm endocarditis as well.  We also suspect that the patient's  CVA was possibly a result of a septic emboli as well.  The patient did have  a PICC line placed and she will require four to six weeks of vancomycin.  The patient has completed 12 days of her vancomycin thus far.  Repeat blood  cultures were negative.  She is afebrile and her white count has been  normalizing.  We suspect she was immunocompromised secondary to  her  prednisone and methotrexate.   PROBLEM #2 -  NEUROLOGY:  The patient presented with right MCA CVA.  Again,  this was felt to be secondary to septic emboli versus right ICA disease  which is greater tan 80%.  She did not have a CVTS evaluation because she is  currently not a candidate for a CEA.  This may need to be pursued, however,  as an outpatient.  The patient also had some dysphagia and dysarthria.  She  was evaluated by speech and underwent a modified barium swallow, confirming  high risk for aspiration.  She was NPO and we have subsequently placed a  PEG.   PROBLEM #3 -  PULMONARY:  The patient had some multifactorial respiratory  insufficiency secondary to abscess as well as aspiration pneumonia.  This  has improved.   PROBLEM #4 -  HYPERTENSION:  Prior to the admission, she was not on any  antihypertensives.  We have achieved good blood pressure control with  clonidine and calcium channel blocker.   PROBLEM #5 -  RHEUMATOID ARTHRITIS:  The patient has known underlying  rheumatoid arthritis and was on prednisone and  methotrexate.  These have had  to be discontinued secondary to her underlying infections.  Complicating the  issue is that she has had some seating of her joints.  The patient's biggest  complaint during this hospitalization was her joint pain.  We have started  the patient on a Fentanyl patch with improved pain control.   PROBLEM #6 -  THROMBOCYTOSIS:  The patient's platelets have reached over  1000 during this admission.  They are felt to be reactive but secondary to  her risk for stroke, we have started her on some aspirin.   PROBLEM #6 -  IRON-DEFICIENCY ANEMIA:  Iron studies are consistent with iron-  deficiency, although she is probably anemic from her underlying critical  illness.  The patient did receive two units of packed red blood cells during  this admission but is otherwise hemodynamically stable.   PROBLEM #7 -  MILD STEROID INDUCED HYPERGLYCEMIA WITH NORMAL HEMOGLOBIN A1C:  As noted, the patient's steroids have been discontinued secondary to her  underlying infections.   LABORATORY DATA AT DISCHARGE:  Carotid Dopplers performed on April 10, 2004,  revealed right 40 to 60% ICA stenosis; however, at the area of bifurcation  it was 86%.  On the left, there was also 40 to 60% distal ICA stenosis.  Vertebral artery flow was antegrade. BUN was 21, creatinine 0.8.  White  count was 14,000, hemoglobin 10, hematocrit 31.2, platelet count was  1,141,000.  Hemoglobin A1C was normal at 5.6%.  Alkaline phosphatase was  elevated at 177, but trending down. This was felt to be secondary to  methotrexate.  Homocystine was 8.1.  Fasting lipid profile was normal.  Prealbumin was 5.6.  Iron was 12, TIBC 130, percent saturation 9.  Blood  cultures were negative x2.   DISCHARGE MEDICATIONS:  1. Albuterol nebulizer q.i.d.  2. Clonidine 0.1 mg patch to be changed every seven days.  3. Aspirin 325 mg daily.  4. Prevacid 30 mg daily. 5. Duragesic patch 50 mcg to be changed every 72 hours.   6. Norvasc 5 mg daily.  7. Iron sulfate 300 mg t.i.d.  8. Vancomycin 1 g IV q.24h. for a total of four to six weeks.  9. Ambien 5 mg q.h.s. p.r.n., dispense as written.  10.      GoLYTELY p.r.n.   NUTRITION:  She  is receiving Jevity 1.2 calories at 55 mL per hour and she  is receiving free water 200 mL q.i.d.   PHYSICAL EXAMINATION AT DISCHARGE:  GENERAL APPEARANCE:  This is a thin,  elderly African American female.  VITAL SIGNS:  She is afebrile, blood pressure 123/56, heart rate 95,  respiratory rate 20, O2 saturation 92% on two liters.  HEENT:  Unremarkable except for increased oral secretions.  NECK:  Without adenopathy or JVD.  LUNGS:  Lungs revealed some anterior secretions and basilar rhonchi.  CARDIOVASCULAR:  Regular.  ABDOMEN:  Bowel sounds are present, abdomen is soft and nontender.  She does  have a PEG in place.  EXTREMITIES:  Without edema.  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  NEUROLOGIC:  The patient is alert and oriented.  She is dysarthric but she  does move all four extremities.  She has diminished grip in both hands but  this is from her rheumatoid arthritis.   FOLLOW UP:  The patient should follow up with Dr. Amador Cunas in two to  three weeks.  She will also need follow-up with infectious disease regarding  the timing of discontinuing her vancomycin.      Cornell Barman, P.A. [facility]                  Rene Paci, M.D. [facility]    LC/MEDQ  D:  04/23/2004  T:  04/23/2004  Job:  [id number]   cc:   Gordy Savers, M.D. [facility]   Vikki Ports, M.D.  [address]  [place]  [place] [address]  Fax: [phone]   Dr. Orvan Falconer

## 2011-05-02 NOTE — Procedures (Signed)
RECORDING TIME:  Twenty-one minutes.   MEDICAL RECORD NUMBER:  [id number]   DESCRIPTION OF PROCEDURE:  The patient is described as awake but confused, a  77 year old right handed female who was not able to cooperative with the  exam.  Therefore, activating procedures had to be delayed.   MEDICATIONS:  The patient is on vancomycin, Jevity, Compazine, Reglan,  Zofran, Narcan, Benadryl, Ventolin, Duragesic and Dilaudid.   This is a 17-channel EEG recording with 1 channel representing heart rate  and rhythm exclusively.  The International 10-20 placement system of  electrodes was used to organize the study.   DESCRIPTION OF PROCEDURE:  A posterior dominant background could be elicited  over the left hemisphere and shows a background of 8.5 over the right  hemisphere, however, the background is slower with an underlying theta  rhythm.  The patient also has higher amplitudes within the right hemisphere  and shows a significant amount of motion artifact during the recording,  limiting the interpretation possibilities.  No epileptiform discharges were  seen in this recording, but provocation maneuvers had to be deferred.  The  patient had never fallen asleep.  This is an abnormal EEG.   CONCLUSION:  Abnormal EEG with borderline slow background in general, right-  sided hemispheric slowing especially temporal on the right with amplitudes  of 4 to 5 hertz.  No clinical epilepsy was noted by the technician's note.   Clinical correlation was recommended, especially in imaging study.    Melvyn Novas, M.D.   XB:JYNW  D:  04/15/2004 19:29:10  T:  04/16/2004 05:48:58  Job #:  [id number]

## 2011-05-02 NOTE — Op Note (Signed)
NAME:  Alyssa Waters, Alyssa Waters              ACCOUNT NO.:  [id number]   MEDICAL RECORD NO.:  [id number]          PATIENT TYPE:  AMB   LOCATION:  SDS                          FACILITY:  [facility]   PHYSICIAN:  Rose Phi. Maple Hudson, M.D.   DATE OF BIRTH:  1931/09/17   DATE OF PROCEDURE:  10/26/2006  DATE OF DISCHARGE:                                 OPERATIVE REPORT   PREOPERATIVE DIAGNOSIS:  Carcinoma, right breast.   POSTOPERATIVE DIAGNOSIS:  Carcinoma, right breast.   OPERATION:  Right partial mastectomy.   SURGEON:  Dr. Francina Ames   ANESTHESIA:  MAC.   OPERATIVE PROCEDURE:  The patient was placed on the operating table with the  arms extended on the arm board and the right breast prepped and draped in  the usual fashion.  A radial-type incision, transverse in nature, was  outlined over the palpable nodule in the medial portion of the right breast.  An ellipse of skin was also outlined in this area, and then we thoroughly  infiltrated the tissue with local anesthetic mixture.   Incisions were made, and I did a wide excision of this palpable mass.  Hemostasis obtained with cautery.  Specimen oriented for the pathologist.   With good hemostasis, I closed it in 2 layers of 3-0 Monocryl followed by a  subcuticular 4-0 Monocryl and Dermabond.  A light dressing was then applied  and the patient transferred to the recovery room in satisfactory condition  having tolerated the procedure well.      Rose Phi. Maple Hudson, M.D.  Electronically Signed     PRY/MEDQ  D:  10/26/2006  T:  10/26/2006  Job:  [id number]

## 2011-05-02 NOTE — Op Note (Signed)
NAME:  Alyssa Waters, CLOER                        ACCOUNT NO.:  [id number]   MEDICAL RECORD NO.:  [id number]                   PATIENT TYPE:  INP   LOCATION:  5707                                 FACILITY:  [facility]   PHYSICIAN:  Vikki Ports, M.D.         DATE OF BIRTH:  June 08, 1931   DATE OF PROCEDURE:  04/08/2004  DATE OF DISCHARGE:                                 OPERATIVE REPORT   PREOPERATIVE DIAGNOSIS:  Perineal sepsis.   POSTOPERATIVE DIAGNOSIS:  Perineal sepsis.   PROCEDURE:  Incision and drainage of perineal complex, deep space perineal  abscess.   SURGEON:  Vikki Ports, M.D.   ANESTHESIA:  General.   DESCRIPTION OF PROCEDURE:  The patient was taken to the operating room and  placed in the supine position.  After adequate general anesthesia was  induced using an endotracheal tube, the patient was placed in the lithotomy  position.  A perineal prep was then undertaken.  Over the very large most  posterior portion of the abscess in the left perirectal space was opened.  This was very, very deep and went at least 15 cm deep, with significant  necrosis of the subcutaneous tissue and fat, which all had to be debrided.  This also tunneled and connected to abscesses that extended along the mons  and over the left pubic tubercle.  Separate incisions were made and  aggressive pulse lavage was done after cultures were obtained.  Betadine  packing was packed into the cavities, and the patient was taken to the PACU  in stable condition.                                               Vikki Ports, M.D.    KRH/MEDQ  D:  04/08/2004  T:  04/09/2004  Job:  [id number]

## 2011-05-02 NOTE — H&P (Signed)
NAME:  IZEL, HOCHBERG                        ACCOUNT NO.:  [id number]   MEDICAL RECORD NO.:  [id number]                   PATIENT TYPE:  INP   LOCATION:  1826                                 FACILITY:  [facility]   PHYSICIAN:  Vikki Ports, M.D.         DATE OF BIRTH:  10/05/1931   DATE OF ADMISSION:  04/08/2004  DATE OF DISCHARGE:                                HISTORY & PHYSICAL   ADMISSION DIAGNOSIS:  Large perineal abscess.   HISTORY OF PRESENT ILLNESS:  The patient is a 77 year old black female with  no known significant past medical history.  Over the past four days, the  patient has noted increasing pain and drainage from perineal wound . She  described the discomfort as 7/10 and began having some drainage earlier this  morning. She was brought to the emergency room by her daughter and son, and  I was consulted by Dr. Doug Sou.   PAST MEDICAL HISTORY:  Significant for hypertension.   MEDICATIONS:  Zoloft and hydrochlorothiazide.   ALLERGIES:  No known drug allergies.   SOCIAL HISTORY:  Negative for tobacco or alcohol use.  She lives at home  alone.   PHYSICAL EXAMINATION:  GENERAL:  Age-appropriate black female in mild  distress.  VITAL SIGNS:  Temperature 100.7, blood pressure 128/70, heart rate 92,  respiratory rate 16.  HEENT:  Normocephalic and atraumatic.  Pupils equal, round, and reactive to  light.  NECK:  Supple and soft without thyromegaly or cervical adenopathy.  LUNGS:  Clear to auscultation and percussion x 2.  HEART:  Regular rate and rhythm without murmurs, rubs, or gallops.  ABDOMEN:  Soft, completely nontender with no hepatosplenomegaly.  GU:  Extending above the left pubis, however, there is extensive erythema  and induration.  Perineal exam shows a very large, fluctuant mass in the  left perianal region in the perineum, extending up over the left mons and  posterior to the buttock.  Vaginal exam shows some fullness.  RECTAL:  Exam shows  no masses.   LABORATORY AND X-RAY DATA:  White count is 28,000, hemoglobin 12,000.   IMPRESSION:  Large perineal abscess with leukocytosis.   PLAN:  Incision, drainage, lavage, and packing in the operating room under  general anesthesia.                                               Vikki Ports, M.D.   KRH/MEDQ  D:  04/08/2004  T:  04/08/2004  Job:  [id number]   cc:   Gordy Savers, M.D. [facility]

## 2011-05-02 NOTE — Assessment & Plan Note (Signed)
[facility]                                   ON-CALL NOTE   NAME:Alyssa Waters, Blough                     MRN:          [id number]  DATE:07/11/2006                            DOB:          [birth year]    A phone call comes from her daughter, [name], and [phone], on July 11, 2006  at about 2:30 p.m.  Patient of [name], NP and Dr. Cato Mulligan.   Ms. Kocak has had diarrhea for several days.  Her daughter does not  believe that she has pain or fever.  She got her a whole box of  antidiarrheals from over-the-counter, which she used all of in two days and  it has persisted.  She has been able to eat some, but her daughter does not  think she is eating a whole lot, but apparently there has not been much in  the way of nausea or vomiting.  She just received a diagnosis of breast  cancer, but has not seen a surgeon or done any kind of evaluation or  treatment at this time.   PLAN:  I told [name] that there was no way for me to know appropriate  treatment over the phone.  I recommended that they bring her to the  Emergency Room, that at this point, she may need IV fluids for dehydration  and certainly needed to be evaluated before further treatment was  instituted.                                   Karie Schwalbe, MD   RIL/MedQ  DD:  07/11/2006  DT:  07/11/2006  Job #:  [id number]   cc:   Valetta Mole. Swords, MD

## 2011-05-02 NOTE — Op Note (Signed)
NAME:  Alyssa Waters, Alyssa Waters                        ACCOUNT NO.:  [id number]   MEDICAL RECORD NO.:  [id number]                   PATIENT TYPE:  INP   LOCATION:  5741                                 FACILITY:  [facility]   PHYSICIAN:  Gabrielle Dare. Janee Morn, M.D.             DATE OF BIRTH:  [birth year]   DATE OF PROCEDURE:  04/16/2004  DATE OF DISCHARGE:                                 OPERATIVE REPORT   PREOPERATIVE DIAGNOSIS:  Need for enteral access.   POSTOPERATIVE DIAGNOSIS:  Need for enteral access.   PROCEDURE:  Esophagogastroduodenoscopy and percutaneous endoscopic  gastrostomy tube placement.   SURGEON:  Gabrielle Dare. Janee Morn, M.D.   ASSISTANT:  Phineas Semen, P.A.   HISTORY OF PRESENT ILLNESS:  The patient is a 77 year old African-American  female admitted by Dr. Danna Hefty for extensive perineal abscess.  She has recently failed swallowing studies  and enteral access is needed.  I  spoke with the patient and her family and they consent for PEG tube  placement, and she is taken down to the endo suite today to undergo this  procedure.   PROCEDURE IN DETAIL:  The patient was monitored in the endoscopy suite.  Conscious sedation was given.  Throughout the procedure, a total of 4 mg of  Versed and 100 mcg of fentanyl were given.  The patient's mouth was numb  from Cetacaine.  The esophagogastroduodenoscope was inserted down into her  esophagus and along into her stomach.  Once this was accomplished, her Panda  tube was removed.  No abnormalities were noted in the esophagus as the scope  was inserted.  The stomach was inflated and inspected.  No ulcers or other  abnormalities were seen.  The pylorus was entered and then the scope was  inserted down to the second portion fo the duodenum.  No ulcers or other  abnormalities were seen in the duodenum.  Subsequently, the scope was  withdrawn back into the stomach.  The stomach was inflated and nice, easy  poke was obtained.  The abdomen  was prepped and draped in a sterile fashion.  The angiocath was inserted, followed by the guide wire.  This was grasped  with the snare inside the stomach with the endoscope.  Local anesthetic had  been used at the incision site.  The guide wire was brought back out with  the scope through the patient's mouth.  The PEG was attached to it in  standard fashion and the guide wire was withdrawn, pulling the PEG out  through the abdominal wall.  This was  withdrawn out and the flange was  applied.  The esophagogastroduodenoscope was reinserted down into the  stomach easily and the PEG position was verified.  The flange was secured,  so I allowed the PEG to expand and the esophagogastroduodenoscope was  removed.  The PEG was secured with tape and using the normal flange and we  will also place an  abdominal binder.  The patient tolerated the procedure  very well.                                               Gabrielle Dare Janee Morn, M.D.    BET/MEDQ  D:  04/16/2004  T:  04/17/2004  Job:  [id number]

## 2011-05-15 ENCOUNTER — Encounter: Payer: Self-pay | Admitting: Internal Medicine

## 2011-05-16 ENCOUNTER — Encounter: Payer: Self-pay | Admitting: Internal Medicine

## 2011-05-16 ENCOUNTER — Ambulatory Visit (INDEPENDENT_AMBULATORY_CARE_PROVIDER_SITE_OTHER): Payer: Medicare Other | Admitting: Internal Medicine

## 2011-05-16 ENCOUNTER — Other Ambulatory Visit: Payer: Self-pay | Admitting: Internal Medicine

## 2011-05-16 VITALS — BP 126/90 | HR 94 | Temp 98.2°F

## 2011-05-16 DIAGNOSIS — I509 Heart failure, unspecified: Secondary | ICD-10-CM

## 2011-05-16 DIAGNOSIS — I2699 Other pulmonary embolism without acute cor pulmonale: Secondary | ICD-10-CM

## 2011-05-16 DIAGNOSIS — I10 Essential (primary) hypertension: Secondary | ICD-10-CM

## 2011-05-16 LAB — BASIC METABOLIC PANEL
Calcium: 9.5 mg/dL (ref 8.4–10.5)
Creatinine, Ser: 0.9 mg/dL (ref 0.4–1.2)
GFR: 74.08 mL/min (ref >60.00)

## 2011-05-16 LAB — CBC WITH DIFFERENTIAL/PLATELET
Basophils Relative: 0.9 % (ref 0.0–3.0)
Eosinophils Absolute: 0 10*3/uL (ref 0.0–0.7)
Hemoglobin: 13.5 g/dL (ref 12.0–15.0)
Lymphocytes Relative: 34.9 % (ref 12.0–46.0)
MCHC: 33.2 g/dL (ref 30.0–36.0)
MCV: 87.3 fl (ref 78.0–100.0)
Neutro Abs: 3.5 10*3/uL (ref 1.4–7.7)
RBC: 4.65 Mil/uL (ref 3.87–5.11)

## 2011-05-16 LAB — HEPATIC FUNCTION PANEL
Bilirubin, Direct: 0.1 mg/dL (ref 0.0–0.3)
Total Bilirubin: 0.7 mg/dL (ref 0.3–1.2)
Total Protein: 6.2 g/dL (ref 6.0–8.3)

## 2011-05-16 LAB — POCT INR: INR: 1.6

## 2011-05-16 NOTE — Progress Notes (Signed)
Addended by: Bonnye Fava on: 05/16/2011 10:35 AM   Modules accepted: Orders

## 2011-05-16 NOTE — Patient Instructions (Signed)
  Latest dosing instructions   Total Sun Mon Tue Wed Thu Fri Sat   35 5 mg 5 mg 5 mg 5 mg 5 mg 5 mg 5 mg    (5 mg1) (5 mg1) (5 mg1) (5 mg1) (5 mg1) (5 mg1) (5 mg1)        

## 2011-05-16 NOTE — Progress Notes (Signed)
  Subjective:    Patient ID: Alyssa Waters, female    DOB: January 01, 1931, 77 y.o.   MRN: [id number]  HPI  htn---tolerating meds  Hx of endocarditis---no known recurrence  Hx DVT/PE---chronic anticoagulation  Has had a few falls---son states that maybe she falls once every quarter---pt describes dizziness prior to fall.Pt has a walker---refuses to use it. Pt and son understand the risk of falls while on warfarin.   Past Medical History  Diagnosis Date  . HYPERTENSION 08/09/2007  . GERD (gastroesophageal reflux disease)   . Breast cancer   . Rheumatoid arthritis   . Stroke   . Endocarditis   . PE (pulmonary embolism)   . Hyperlipidemia    Past Surgical History  Procedure Date  . Abdominal hysterectomy   . Tubal ligation   . Breast lumpectomy 2007    right  . Tonsillectomy   . Varicose vein surgery 1971  . Rotator cuff repair 1986    right  . Rectocele repair 1998  . Peg tube placement 04/2002    reports that she has quit smoking. She does not have any smokeless tobacco history on file. She reports that she drinks alcohol. Her drug history not on file. family history includes Cancer in her mother and Heart attack in her father. No Known Allergies   Review of Systems  patient denies chest pain, shortness of breath, orthopnea. Denies lower extremity edema, abdominal pain, change in appetite, change in bowel movements. Patient denies rashes, musculoskeletal complaints. No other specific complaints in a complete review of systems.      Objective:   Physical Exam  Well-developed well-nourished female in no acute distress. HEENT exam atraumatic, normocephalic, extraocular muscles are intact. Neck is supple. No jugular venous distention no thyromegaly. Chest clear to auscultation without increased work of breathing. Cardiac exam S1 and S2 are irregular. Abdominal exam active bowel sounds, soft, nontender. Extremities no edema. Neurologic exam she is alert without any motor sensory  deficits. Gait is broad based        Assessment & Plan:

## 2011-05-16 NOTE — Assessment & Plan Note (Signed)
No sx's 

## 2011-05-16 NOTE — Assessment & Plan Note (Signed)
Chronic anticoagulation. For the time being I think benefits outweigh risks See new warfarin dose

## 2011-05-16 NOTE — Assessment & Plan Note (Signed)
Reasonable control i don't think her subjective dizziness is related to orthostasis

## 2011-05-19 NOTE — Telephone Encounter (Signed)
Refill Warfarin to [facility] on [address].

## 2011-05-20 NOTE — Telephone Encounter (Signed)
rx sent in electronically 

## 2011-05-26 ENCOUNTER — Ambulatory Visit (INDEPENDENT_AMBULATORY_CARE_PROVIDER_SITE_OTHER): Payer: Medicare Other

## 2011-05-26 DIAGNOSIS — I2699 Other pulmonary embolism without acute cor pulmonale: Secondary | ICD-10-CM

## 2011-05-26 LAB — POCT INR: INR: 3.7

## 2011-05-26 NOTE — Patient Instructions (Signed)
5 mg on wednesdays and sundays 2.5mg  on other days,check in 2 weeks

## 2011-05-28 ENCOUNTER — Other Ambulatory Visit: Payer: Self-pay | Admitting: Internal Medicine

## 2011-06-02 ENCOUNTER — Ambulatory Visit: Payer: Medicare Other

## 2011-06-02 DIAGNOSIS — I2699 Other pulmonary embolism without acute cor pulmonale: Secondary | ICD-10-CM

## 2011-06-02 LAB — POCT INR: INR: 2.8

## 2011-06-02 NOTE — Patient Instructions (Signed)
5 mg on wednesdays and sundays 2.5mg  on other days,check in 4 weeks

## 2011-06-11 ENCOUNTER — Other Ambulatory Visit: Payer: Self-pay | Admitting: Internal Medicine

## 2011-06-14 ENCOUNTER — Other Ambulatory Visit: Payer: Self-pay | Admitting: Internal Medicine

## 2011-06-16 ENCOUNTER — Encounter: Payer: Self-pay | Admitting: Internal Medicine

## 2011-06-16 ENCOUNTER — Ambulatory Visit (INDEPENDENT_AMBULATORY_CARE_PROVIDER_SITE_OTHER): Payer: Medicare Other | Admitting: Internal Medicine

## 2011-06-16 DIAGNOSIS — E785 Hyperlipidemia, unspecified: Secondary | ICD-10-CM

## 2011-06-16 DIAGNOSIS — I2699 Other pulmonary embolism without acute cor pulmonale: Secondary | ICD-10-CM

## 2011-06-16 DIAGNOSIS — I38 Endocarditis, valve unspecified: Secondary | ICD-10-CM

## 2011-06-16 DIAGNOSIS — I10 Essential (primary) hypertension: Secondary | ICD-10-CM

## 2011-06-16 NOTE — Progress Notes (Signed)
  Subjective:    Patient ID: Alyssa Waters, female    DOB: 1931-04-07, 77 y.o.   MRN: [id number]  HPI  htn---no home bps  PE/DVT---tolerating warfarin  Stroke---no recurrence  Endocarditis---no fever, chills, rash. She has completed ABX  Past Medical History  Diagnosis Date  . HYPERTENSION 08/09/2007  . GERD (gastroesophageal reflux disease)   . Breast cancer   . Rheumatoid arthritis   . Stroke   . Endocarditis   . PE (pulmonary embolism)   . Hyperlipidemia    Past Surgical History  Procedure Date  . Abdominal hysterectomy   . Tubal ligation   . Breast lumpectomy 2007    right  . Tonsillectomy   . Varicose vein surgery 1971  . Rotator cuff repair 1986    right  . Rectocele repair 1998  . Peg tube placement 04/2002    reports that she has quit smoking. She does not have any smokeless tobacco history on file. She reports that she drinks alcohol. Her drug history not on file. family history includes Cancer in her mother and Heart attack in her father. No Known Allergies   Review of Systems  patient denies chest pain, shortness of breath, orthopnea. Denies lower extremity edema, abdominal pain, change in appetite, change in bowel movements. Patient denies rashes, musculoskeletal complaints. No other specific complaints in a complete review of systems.      Objective:   Physical Exam  Well-developed well-nourished female in no acute distress. HEENT exam atraumatic, normocephalic, extraocular muscles are intact. Neck is supple. No jugular venous distention no thyromegaly. Chest clear to auscultation without increased work of breathing. Cardiac exam S1 and S2 are irregular. Abdominal exam active bowel sounds, soft, nontender. Extremities no edema. Neurologic exam she is alert without any motor sensory deficits. She requires assistance with ambulation        Assessment & Plan:

## 2011-06-16 NOTE — Assessment & Plan Note (Signed)
No recurrent sxs 

## 2011-06-16 NOTE — Assessment & Plan Note (Signed)
Repeat bp much better Continue same meds for now

## 2011-06-16 NOTE — Assessment & Plan Note (Signed)
Intolerant to statins---her report Probably reasonable to re challenge---she states one of her physicians is checking lipid panel next week

## 2011-06-30 ENCOUNTER — Other Ambulatory Visit: Payer: Self-pay | Admitting: Internal Medicine

## 2011-06-30 DIAGNOSIS — Z9889 Other specified postprocedural states: Secondary | ICD-10-CM

## 2011-07-09 ENCOUNTER — Other Ambulatory Visit: Payer: Self-pay | Admitting: Oncology

## 2011-07-09 ENCOUNTER — Encounter (HOSPITAL_BASED_OUTPATIENT_CLINIC_OR_DEPARTMENT_OTHER): Payer: PRIVATE HEALTH INSURANCE | Admitting: Oncology

## 2011-07-09 DIAGNOSIS — I10 Essential (primary) hypertension: Secondary | ICD-10-CM

## 2011-07-09 DIAGNOSIS — C50319 Malignant neoplasm of lower-inner quadrant of unspecified female breast: Secondary | ICD-10-CM

## 2011-07-09 LAB — CBC WITH DIFFERENTIAL/PLATELET
EOS%: 0.1 % (ref 0.0–7.0)
MCH: 27.8 pg (ref 25.1–34.0)
MCHC: 33.5 g/dL (ref 31.5–36.0)
MCV: 83.1 fL (ref 79.5–101.0)
MONO%: 10.2 % (ref 0.0–14.0)
RBC: 4.78 10*6/uL (ref 3.70–5.45)
RDW: 15.2 % — ABNORMAL HIGH (ref 11.2–14.5)

## 2011-07-10 LAB — COMPREHENSIVE METABOLIC PANEL
ALT: 15 U/L (ref 0–35)
AST: 22 U/L (ref 0–37)
Albumin: 3.8 g/dL (ref 3.5–5.2)
CO2: 27 mEq/L (ref 19–32)
Calcium: 9.5 mg/dL (ref 8.4–10.5)
Chloride: 102 mEq/L (ref 96–112)
Creatinine, Ser: 0.9 mg/dL (ref 0.50–1.10)
Potassium: 3.8 mEq/L (ref 3.5–5.3)

## 2011-07-10 LAB — CANCER ANTIGEN 27.29: CA 27.29: 30 U/mL (ref 0–39)

## 2011-07-11 ENCOUNTER — Other Ambulatory Visit: Payer: Self-pay | Admitting: Oncology

## 2011-07-11 ENCOUNTER — Encounter (HOSPITAL_BASED_OUTPATIENT_CLINIC_OR_DEPARTMENT_OTHER): Payer: PRIVATE HEALTH INSURANCE | Admitting: Oncology

## 2011-07-11 DIAGNOSIS — C50319 Malignant neoplasm of lower-inner quadrant of unspecified female breast: Secondary | ICD-10-CM

## 2011-07-11 DIAGNOSIS — C50919 Malignant neoplasm of unspecified site of unspecified female breast: Secondary | ICD-10-CM

## 2011-07-25 ENCOUNTER — Other Ambulatory Visit: Payer: Self-pay | Admitting: Internal Medicine

## 2011-08-05 ENCOUNTER — Other Ambulatory Visit (INDEPENDENT_AMBULATORY_CARE_PROVIDER_SITE_OTHER): Payer: Medicare Other | Admitting: *Deleted

## 2011-08-05 ENCOUNTER — Other Ambulatory Visit: Payer: Self-pay | Admitting: Internal Medicine

## 2011-08-05 ENCOUNTER — Ambulatory Visit
Admission: RE | Admit: 2011-08-05 | Discharge: 2011-08-05 | Disposition: A | Discharge status: Home / Self Care | Payer: Medicare Other | Source: Ambulatory Visit | Attending: Internal Medicine | Admitting: Internal Medicine

## 2011-08-05 DIAGNOSIS — I82409 Acute embolism and thrombosis of unspecified deep veins of unspecified lower extremity: Secondary | ICD-10-CM

## 2011-08-05 DIAGNOSIS — Z9889 Other specified postprocedural states: Secondary | ICD-10-CM

## 2011-08-05 LAB — PROTIME-INR: INR: 2.8 ratio — ABNORMAL HIGH (ref 0.8–1.0)

## 2011-08-09 ENCOUNTER — Other Ambulatory Visit: Payer: Self-pay | Admitting: Internal Medicine

## 2011-08-17 ENCOUNTER — Other Ambulatory Visit: Payer: Self-pay | Admitting: Internal Medicine

## 2011-09-02 ENCOUNTER — Other Ambulatory Visit: Payer: Self-pay | Admitting: Internal Medicine

## 2011-09-02 ENCOUNTER — Ambulatory Visit: Payer: Medicare Other | Admitting: Internal Medicine

## 2011-09-03 ENCOUNTER — Inpatient Hospital Stay (HOSPITAL_COMMUNITY)
Admission: EM | Admit: 2011-09-03 | Discharge: 2011-09-09 | DRG: 313 | Disposition: A | Discharge status: Home / Self Care | Payer: PRIVATE HEALTH INSURANCE | Source: Ambulatory Visit | Attending: Internal Medicine | Admitting: Internal Medicine

## 2011-09-03 ENCOUNTER — Emergency Department (HOSPITAL_COMMUNITY): Payer: PRIVATE HEALTH INSURANCE

## 2011-09-03 DIAGNOSIS — E876 Hypokalemia: Secondary | ICD-10-CM | POA: Diagnosis present

## 2011-09-03 DIAGNOSIS — I2510 Atherosclerotic heart disease of native coronary artery without angina pectoris: Secondary | ICD-10-CM | POA: Diagnosis present

## 2011-09-03 DIAGNOSIS — Z853 Personal history of malignant neoplasm of breast: Secondary | ICD-10-CM

## 2011-09-03 DIAGNOSIS — I10 Essential (primary) hypertension: Secondary | ICD-10-CM | POA: Diagnosis present

## 2011-09-03 DIAGNOSIS — Z86718 Personal history of other venous thrombosis and embolism: Secondary | ICD-10-CM

## 2011-09-03 DIAGNOSIS — G9340 Encephalopathy, unspecified: Secondary | ICD-10-CM | POA: Diagnosis present

## 2011-09-03 DIAGNOSIS — Z8673 Personal history of transient ischemic attack (TIA), and cerebral infarction without residual deficits: Secondary | ICD-10-CM

## 2011-09-03 DIAGNOSIS — IMO0002 Reserved for concepts with insufficient information to code with codable children: Secondary | ICD-10-CM

## 2011-09-03 DIAGNOSIS — I959 Hypotension, unspecified: Secondary | ICD-10-CM | POA: Diagnosis not present

## 2011-09-03 DIAGNOSIS — R1013 Epigastric pain: Secondary | ICD-10-CM | POA: Diagnosis present

## 2011-09-03 DIAGNOSIS — K8020 Calculus of gallbladder without cholecystitis without obstruction: Secondary | ICD-10-CM | POA: Diagnosis present

## 2011-09-03 DIAGNOSIS — Z7901 Long term (current) use of anticoagulants: Secondary | ICD-10-CM

## 2011-09-03 DIAGNOSIS — N289 Disorder of kidney and ureter, unspecified: Secondary | ICD-10-CM | POA: Diagnosis not present

## 2011-09-03 DIAGNOSIS — R072 Precordial pain: Principal | ICD-10-CM | POA: Diagnosis present

## 2011-09-03 DIAGNOSIS — M069 Rheumatoid arthritis, unspecified: Secondary | ICD-10-CM | POA: Diagnosis present

## 2011-09-03 DIAGNOSIS — D509 Iron deficiency anemia, unspecified: Secondary | ICD-10-CM | POA: Diagnosis present

## 2011-09-03 LAB — MAGNESIUM: Magnesium: 1.8 mg/dL (ref 1.5–2.5)

## 2011-09-03 LAB — MRSA PCR SCREENING: MRSA by PCR: NEGATIVE

## 2011-09-03 LAB — COMPREHENSIVE METABOLIC PANEL
ALT: 17 U/L (ref 0–35)
AST: 25 U/L (ref 0–37)
Alkaline Phosphatase: 95 U/L (ref 39–117)
CO2: 31 mEq/L (ref 19–32)
Calcium: 9.9 mg/dL (ref 8.4–10.5)
Chloride: 100 mEq/L (ref 96–112)
GFR calc Af Amer: >60 mL/min (ref >60)
GFR calc non Af Amer: >60 mL/min (ref >60)
Glucose, Bld: 86 mg/dL (ref 70–99)
Potassium: 2.9 mEq/L — ABNORMAL LOW (ref 3.5–5.1)
Sodium: 141 mEq/L (ref 135–145)

## 2011-09-03 LAB — CARDIAC PANEL(CRET KIN+CKTOT+MB+TROPI): CK, MB: 3.7 ng/mL (ref 0.3–4.0)

## 2011-09-03 LAB — CBC
Hemoglobin: 13.4 g/dL (ref 12.0–15.0)
MCH: 28.8 pg (ref 26.0–34.0)
Platelets: 296 10*3/uL (ref 150–400)
RBC: 4.66 MIL/uL (ref 3.87–5.11)
WBC: 7.1 10*3/uL (ref 4.0–10.5)

## 2011-09-03 LAB — POCT I-STAT TROPONIN I: Troponin i, poc: 0.03 ng/mL (ref 0.00–0.08)

## 2011-09-03 LAB — GLUCOSE, CAPILLARY

## 2011-09-03 LAB — CK TOTAL AND CKMB (NOT AT ARMC): Relative Index: 3 — ABNORMAL HIGH (ref 0.0–2.5)

## 2011-09-03 LAB — D-DIMER, QUANTITATIVE: D-Dimer, Quant: <0.22 ug/mL-FEU (ref 0.00–0.48)

## 2011-09-04 ENCOUNTER — Observation Stay (HOSPITAL_COMMUNITY): Payer: PRIVATE HEALTH INSURANCE

## 2011-09-04 DIAGNOSIS — K8020 Calculus of gallbladder without cholecystitis without obstruction: Secondary | ICD-10-CM

## 2011-09-04 LAB — URINALYSIS, ROUTINE W REFLEX MICROSCOPIC
Nitrite: NEGATIVE
Specific Gravity, Urine: 1.017 (ref 1.005–1.030)
pH: 7 (ref 5.0–8.0)

## 2011-09-04 LAB — PROCALCITONIN: Procalcitonin: 0.14 ng/mL

## 2011-09-04 LAB — BASIC METABOLIC PANEL
BUN: 15 mg/dL (ref 6–23)
CO2: 27 mEq/L (ref 19–32)
Calcium: 9.9 mg/dL (ref 8.4–10.5)
Chloride: 107 mEq/L (ref 96–112)
Creatinine, Ser: 0.88 mg/dL (ref 0.50–1.10)

## 2011-09-04 LAB — URINE MICROSCOPIC-ADD ON

## 2011-09-04 LAB — CARDIAC PANEL(CRET KIN+CKTOT+MB+TROPI)
CK, MB: 3.5 ng/mL (ref 0.3–4.0)
Relative Index: 2.3 (ref 0.0–2.5)
Relative Index: 2.3 (ref 0.0–2.5)
Troponin I: <0.3 ng/mL (ref <0.30)

## 2011-09-04 LAB — PROTIME-INR: INR: 2.03 — ABNORMAL HIGH (ref 0.00–1.49)

## 2011-09-05 DIAGNOSIS — I059 Rheumatic mitral valve disease, unspecified: Secondary | ICD-10-CM

## 2011-09-05 LAB — URINE CULTURE
Colony Count: NO GROWTH
Culture  Setup Time: 201209210149
Special Requests: NEGATIVE

## 2011-09-05 LAB — H. PYLORI ANTIBODY, IGG: H Pylori IgG: 0.89 {ISR}

## 2011-09-06 LAB — PROTIME-INR
INR: 3.12 — ABNORMAL HIGH (ref 0.00–1.49)
Prothrombin Time: 32.6 seconds — ABNORMAL HIGH (ref 11.6–15.2)

## 2011-09-06 LAB — RENAL FUNCTION PANEL
Albumin: 3.1 g/dL — ABNORMAL LOW (ref 3.5–5.2)
Calcium: 8.5 mg/dL (ref 8.4–10.5)
Creatinine, Ser: 1.29 mg/dL — ABNORMAL HIGH (ref 0.50–1.10)
GFR calc Af Amer: 48 mL/min — ABNORMAL LOW (ref >60)
GFR calc non Af Amer: 40 mL/min — ABNORMAL LOW (ref >60)
Sodium: 146 mEq/L — ABNORMAL HIGH (ref 135–145)

## 2011-09-06 LAB — CBC
MCH: 28.2 pg (ref 26.0–34.0)
MCHC: 33 g/dL (ref 30.0–36.0)
MCV: 85.3 fL (ref 78.0–100.0)
Platelets: 282 10*3/uL (ref 150–400)
RBC: 4.08 MIL/uL (ref 3.87–5.11)

## 2011-09-06 LAB — FERRITIN: Ferritin: 121 ng/mL (ref 10–291)

## 2011-09-06 LAB — FOLATE: Folate: >20 ng/mL

## 2011-09-06 LAB — RPR: RPR Ser Ql: NONREACTIVE

## 2011-09-06 LAB — PREALBUMIN: Prealbumin: 21.7 mg/dL (ref 17.0–34.0)

## 2011-09-07 LAB — CBC
MCV: 84.6 fL (ref 78.0–100.0)
Platelets: 293 10*3/uL (ref 150–400)
RDW: 15.8 % — ABNORMAL HIGH (ref 11.5–15.5)
WBC: 7.5 10*3/uL (ref 4.0–10.5)

## 2011-09-07 LAB — PROTIME-INR: Prothrombin Time: 31.3 seconds — ABNORMAL HIGH (ref 11.6–15.2)

## 2011-09-07 LAB — BASIC METABOLIC PANEL
Chloride: 112 mEq/L (ref 96–112)
Creatinine, Ser: 0.84 mg/dL (ref 0.50–1.10)
GFR calc Af Amer: >60 mL/min (ref >60)
Potassium: 3.3 mEq/L — ABNORMAL LOW (ref 3.5–5.1)
Sodium: 144 mEq/L (ref 135–145)

## 2011-09-08 ENCOUNTER — Ambulatory Visit: Payer: Self-pay | Admitting: Internal Medicine

## 2011-09-08 LAB — PROTIME-INR: INR: 3.08 — ABNORMAL HIGH (ref 0.00–1.49)

## 2011-09-08 NOTE — H&P (Signed)
NAME:  Alyssa Waters, Alyssa Waters NO.:  [id number]  MEDICAL RECORD NO.:  [id number]  LOCATION:  [facility]                         FACILITY:  [facility]  PHYSICIAN:  Jonny Ruiz, MD    DATE OF BIRTH:  [birth year]  DATE OF ADMISSION:  09/03/2011 DATE OF DISCHARGE:                             HISTORY & PHYSICAL   PRIMARY CARE PHYSICIAN:  Valetta Mole. Swords, MD  CHIEF COMPLAINT:  Chest pain.  HISTORY OF PRESENT ILLNESS:  The patient is a 77 year old female with a past medical history significant for hypertension who was doing well until this morning when she developed precordial chest pain, pressure- like sensation, gradual sudden onset, radiating to her back, associated with shortness of breath, lasting over 1 hour, without associated dizziness, palpitations or diaphoresis.  Denies syncope.  The patient was brought to the ED at [facility] and was found initially with a blood pressure 163/85, pulse 80, and with chest pain, right on the left mid chest.  She was able to point specifically where the pain was.  The pain was worse with deep inspiration and it occurred at rest.  The patient has not taken any of her chronic medications this morning.  Daughter is at the bedside.  She is on Coumadin for her previous stroke and her INR is 1.93.  PAST MEDICAL HISTORY: 1. Rheumatoid arthritis. 2. Breast cancer. 3. Stroke. 4. Endocarditis and septic emboli causing stroke. 5. Coronary artery disease. 6. History of iron deficiency anemia. 7. Hypertension. 8. MRSA in 2005.  PAST SURGICAL HISTORY:  Mastectomy.  CURRENT MEDICATIONS: 1. Hydrocodone/acetaminophen 5/500 b.i.d. 2. Potassium chloride 20 mEq daily. 3. Clonazepam 0.1 mg p.o. b.i.d. 4. Prednisone 5 mg once a day. 5. Coumadin 5 mg once a day. 6. Prozac 20 mg a day. 7. Hydrochlorothiazide 25 mg a day. 8. Anastrozole 1 mg once a day.  ALLERGIES:  The patient is allergic to TAPE.  No known drug allergies.  FAMILY HISTORY:   Noncontributory.  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  Denies tobacco, alcohol, or illicits. She lives with one son.  She had four sons and one daughter.  She is a full code.  REVIEW OF SYSTEMS:  CONSTITUTIONAL:  Denies fever, chills, or night sweats.  CARDIOVASCULAR:  Denies edema, orthopnea, or nocturia, otherwise see HPI.  RESPIRATORY:  Denies cough or wheezes.  GI: Complains of heartburn frequently without nausea, vomiting, diarrhea, or constipation.  No hematochezia or melena.  GU:  Denies dysuria, frequency, or hematuria.  NEUROLOGIC:  Denies headaches, focal weakness, numbness, or paresthesias.  PHYSICAL EXAMINATION:  VITAL SIGNS:  Blood pressure 210/113, pulse 80, respirations 20, and temperature 99.2. GENERAL APPEARANCE:  The patient is a slim, African American female who is alert in no acute distress. HEENT: Atraumatic.  Eyes:  PERRLA, EOMI.  Nose:  There is a superficial ulcer on the left nostril.  No drainage.  Oral cavity:  Moist mucosa. NECK:  Supple.  No carotid bruits.  No lymphadenopathy.  No JVD. HEART:  Irregular heartbeat without gallops, murmurs, or rubs. LUNGS:  Clear to auscultation. ABDOMEN:  Nondistended.  Normal bowel sounds.  Tender on epigastrium and right upper quadrant.  No organomegaly or masses palpable. EXTREMITIES:  No clubbing, cyanosis, or edema. NEUROLOGIC: Cranial nerves II-XII intact.  Motor strength 5/5 in the upper and lower extremities.  Reflexes 2+ on the patella.  Babinski negative.  No sensory deficits.  LABORATORY ASSESSMENT:  Comprehensive metabolic panel is normal except for a potassium of 2.9.  CBC normal.  Troponin 0.03.  INR 1.93.  D-dimer negative.  Chest x-ray:  Scattered pulmonary parenchymal scarring without acute finding.  EKG:  Normal sinus rhythm, rate 75 bpm, normal axis, normal QRS, normal ST and T-waves.  ASSESSMENT AND PLAN:  This is a 77 year old  female with a past medical history significant for hypertension and stroke presenting to the ED for chest pain lasting more than an hour at rest with negative troponin and normal EKG.  Incidentally found with hypokalemia and severe hypertension.  PROBLEM LIST: 1. Severe hypertension. 2. Chest pain, atypical, not cardiac. 3. Hypokalemia, likely due to diuretic use.  PLAN: 1. Admit the patient to Step-Down Unit for control of her severe     hypertension.  Start with labetalol 20 mg IV,     may repeat in 5 minutes as needed.  Start benazepril 20 mg a day.     Discontinue hydrochlorothiazide.  Replace potassium K-Dur 40 p.o.     q.2 x3 doses and recheck magnesium and BNP in the morning. 2. Cycle cardiac enzymes and repeat EKG in the morning.  Consider     obtaining right upper quadrant ultrasound.          ______________________________ Jonny Ruiz, MD     GL/MEDQ  D:  09/03/2011  T:  09/03/2011  Job:  [id number]  Electronically Signed by Jonny Ruiz MD on 09/08/2011 04:18:28 PM

## 2011-09-09 LAB — BASIC METABOLIC PANEL
BUN: 15 mg/dL (ref 6–23)
GFR calc non Af Amer: >60 mL/min (ref >60)
Glucose, Bld: 80 mg/dL (ref 70–99)
Potassium: 3.9 mEq/L (ref 3.5–5.1)

## 2011-09-10 ENCOUNTER — Telehealth: Payer: Self-pay | Admitting: Internal Medicine

## 2011-09-10 NOTE — Telephone Encounter (Signed)
Was discharges from hospital was was told that she will need a hospital follow up within 1 week. Please advise. Thanks.

## 2011-09-11 LAB — CULTURE, BLOOD (ROUTINE X 2)
Culture  Setup Time: 201209210206
Culture: NO GROWTH

## 2011-09-11 NOTE — Discharge Summary (Signed)
NAMEMarland Waters  Alyssa, Waters NO.:  [id number]  MEDICAL RECORD NO.:  [id number]  LOCATION:  5032                         FACILITY:  [facility]  PHYSICIAN:  Lonia Blood, M.D.DATE OF BIRTH:  25-Sep-1931  DATE OF ADMISSION:  09/03/2011 DATE OF DISCHARGE:  09/09/2011                              DISCHARGE SUMMARY   PRIMARY CARE PHYSICIAN:  Alyssa Mole. Swords, MD  DISCHARGE DIAGNOSES: 1. Chest/epigastric pain.     a.     Workup for abdominal/GI cause is unremarkable.     b.     Cardiac enzymes unrevealing with unrevealing EKG.     c.     Symptoms resolved.     d.     Possibly related to bisphosphonate therapy at home. 2. Severe acute delirium.     a.     Metabolic evaluation unrevealing.     b.     Possibly related to aggressive correction of hypertension      leading to hypoperfusion.     c.     Improved with permissive hypertension.     d.     At baseline at the time of discharge. 3. Acute renal insufficiency.    a.     Felt to be related to aggressive medical therapy for      hypertension.     b.     Renal function normalized at the time of discharge with      creatinine of 0.84. 4. Cholelithiasis - not felt to be symptomatic. 5. Accelerated hypertension alternating with hypotension.     a.     The patient does not tolerate hypotension or even "normal"      blood pressure very well.     b.     Decision made to pursue permissive hypertension with a goal      of systolic blood pressure approximately 140.     c.     Further titration of blood pressure medications likely to be      required in the outpatient setting. 6. Rheumatoid arthritis on chronic steroid therapy. 7. 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 8. Chronic iron-deficiency anemia. 9. History of breast cancer.  DISCHARGE MEDICATIONS:  Complete list of the patient's discharge medications is available as per the discharge med manager portion of the patient's  e-chart computer file.  PROCEDURES: 1. Abdominal ultrasound September 04, 2011, - cholelithiasis without     evidence of biliary obstruction or cholecystitis by ultrasound.     Mildly echogenic kidneys without obstruction.  Findings consistent     with chronic disease. 2. Transthoracic echocardiogram September 05, 2011, - LV cavity size     is small.  No outflow obstruction arrest.  Wall thickness increased     in the pattern of mild LVH.  Systolic function vigorous with EF of     65% to 70%.  Doppler parameters consistent with abnormal left     ventricular relaxation consistent with grade 1 diastolic     dysfunction.  CONSULTATIONS:  General Surgery.  FOLLOWUP:  The patient is advised to follow up with her primary care physician, Dr. Birdie Sons  within 5-7 days.  At that time, recheck of her blood pressure should be accomplished.  Consideration should also be given to rechecking her potassium level within the next 7-10 days as the patient did require potassium replacement during her hospital stay and is discharged on ongoing potassium treatment.  HOSPITAL COURSE:  Alyssa Waters is a very pleasant 77 year old female with a complex past medical history as detailed above.  She presented to the hospital on date of her admission with complaints of chest pain.  She reported she was doing well until the morning of her presentation when she developed the acute onset of precordial chest pain, which is initially described as pressure-like, but then also at times described as sharp.  It was gradual in onset, radiated to her back as well.  There was some transient shortness of breath associated with this pain.  She denied dizziness, palpitations, or diaphoresis, and there was no evidence of syncope.  The patient presented to the hospital for evaluation.  Given her complex history, it was felt that inpatient evaluation will be most appropriate.  She was admitted to the telemetry unit.   Cardiac enzymes were cycled and serial EKGs were accomplished. These failed to reveal any evidence of acute abnormalities.  The admitting physician was concerned about the possibility of acute cholecystitis.  General Surgery was consulted.  They evaluated the patient and they did not feel that the patient's physical exam was consistent with acute cholecystitis.  Abdominal ultrasound had been accomplished, revealing cholelithiasis, but also supported the fact that there was likely no cholecystitis.  Subsequent physical exams by the rounding physician also were in agreement with the fact that the patient did not have acute cholecystitis.  The patient was noted to have significant hypertension at the time of her presentation.  The patient's blood pressure reached a peak of 210/113.  There was concern that this could be the etiology of the patient's chest discomfort.  The patient was aggressively treated to correct her malignant hypertension.  She quickly corrected and then actually sworn to the opposite extreme with episodes of blood pressures into the 80s and 90s.  Concomitant with this, the patient became quite encephalopathic.  The new blood pressure medications were discontinued abruptly.  An approach of permissive hypertension was pursued with a goal systolic in the 140 range.  Metabolic evaluation for acute delirium was also carried out.  This was unrevealing including normal B12, folic acid, and RPR.  Imaging of the head was not carried out as the patient was not displaying focal neurologic deficits and there was no evidence on physical exam of acute intracranial hemorrhage.  Fortunately, with return of the patient's blood pressure to her baseline levels, her mental status rapidly returned to normal.  She has remained stable throughout remainder of her hospital stay.  Shortly after the patient's episode of hypotension, she also developed a significant renal insufficiency.  At the  time of her presentation, the patient's creatinine was 0.83.  She reached a peak of 1.29, but fortunately she quickly returned back to her baseline of 0.84.  Her creatinine is 0.84 at the time of her discharge.  Potassium will be reassessed immediately prior to her discharge, but is presently 3.3. She is tolerating her current medications well without further adjustments being required at this time.  PHYSICAL EXAMINATION PRIOR TO DISCHARGE:  VITAL SIGNS:  Temperature is 97.9, blood pressure is 121/92 to 152/70, heart rate is 71, respiratory rate is 18, O2 sats 99% on room  air. LUNGS:  Clear to auscultation bilaterally without wheezes or rhonchi. CARDIOVASCULAR:  Regular rate and rhythm without murmur, gallop, or rub. Normal S1 and S2. ABDOMEN:  Thin, soft.  Bowel sounds present.  No organomegaly, no rebound, no ascites. EXTREMITIES:  There is no significant cyanosis, clubbing, or edema in bilateral lower extremities.     Lonia Blood, M.D.     JTM/MEDQ  D:  09/08/2011  T:  09/08/2011  Job:  [id number]  cc:   Alyssa Mole. Swords, MD  Electronically Signed by Jetty Duhamel M.D. on 09/11/2011 09:49:01 AM

## 2011-09-11 NOTE — Consult Note (Signed)
NAMEMarland Waters  DEVINA, BEZOLD NO.:  [id number]  MEDICAL RECORD NO.:  [id number]  LOCATION:  2923                         FACILITY:  [facility]  PHYSICIAN:  Ollen Gross. Vernell Morgans, M.D. DATE OF BIRTH:  August 10, 1931  DATE OF CONSULTATION:  09/04/2011 DATE OF DISCHARGE:                                CONSULTATION   REQUESTING PHYSICIAN:  Marinda Elk, MD.  CONSULTING SURGERY:  Ollen Gross. Vernell Morgans, MD  PRIMARY CARE PHYSICIAN:  Valetta Mole. Swords, MD  REASON FOR CONSULTATION:  Gallstones.  HISTORY OF PRESENT ILLNESS:  Alyssa Waters is a 77 year old black female with a history of CVA, rheumatoid arthritis, breast cancer, coronary artery disease and hypertension, who apparently have some baseline dementia and is not a great historian.  Therefore, some of this information is obtained from the Primary Service as well as the patient. The patient states that she developed chest pain yesterday around 06:30 a.m.  She states that she got some slight nausea, but no emesis. Otherwise, she does not report a significant amount of other history except for the even came to the emergency department.  She states that she had similar pain to this years ago when she had her stroke. According to the nurse as well as the primary medical team, apparently, the patient's daughter reports the patient has had diarrhea for the last several weeks.  It is also noted that the patient just started her Fosamax yesterday over the past couple days again after having her Fosamax held for GI symptoms.  It is unclear whether the patient developed worsening abdominal pain after eating as she apparently has fairly poor p.o. intake in general.  The patient was admitted for a cardiac workup.  This has been essentially negative.  She then had ultrasound of the abdomen which did reveal gallstones, but no evidence of gallbladder wall thickening or cholecystitis.  We have been asked to evaluate the patient.  REVIEW OF  SYSTEMS:  Please see HPI.  Otherwise, all other systems have been reviewed and are negative.  FAMILY HISTORY:  Noncontributory.  PAST MEDICAL HISTORY: 1. Hypertension. 2. Coronary artery disease. 3. History of endocarditis with a embolic stroke. 4. History of CVA. 5. Breast cancer. 6. Rheumatoid arthritis.  PAST SURGICAL HISTORY: 1. Partial mastectomy. 2. Hysterectomy. 3. Percutaneous endoscopic gastrostomy tube placement. 4. Some type of left wrist surgery. 5. Incision and drainage of perineal abscess.  SOCIAL HISTORY:  The patient apparently lives with her son.  She has 4 sons and 1 daughter.  She states that she quit smoking approximately 3 years ago.  She also states that she quit drinking alcohol several years ago, but states she was a heavy alcoholic user when she did use.  She denies any illicit drug abuse.  ALLERGIES:  TAPE.  MEDICATIONS AT HOME:  Include 1. Vicodin. 2. Potassium chloride. 3. Clonazepam. 4. Prednisone. 5. Coumadin. 6. Prozac. 7. Hydrochlorothiazide. 8. Anastrozole.  PHYSICAL EXAMINATION:  GENERAL:  Alyssa Waters is a 77 year old black female who appear somewhat frail and currently lying in bed in no acute distress. VITAL SIGNS:  Temperature 97.4, pulse 68, respirations 15, blood pressure 100/67. HEENT:  Head is normocephalic, atraumatic.  Sclerae noninjected.  Pupils are equal, round and reactive to light.  Ears and nose without any obvious masses or lesions.  No rhinorrhea.  Mouth is pink. HEART:  Irregular.  She does have a normal S1-S2 with no murmurs, gallops or rubs are noted.  She does have palpable carotid, radial and pedal pulses bilaterally. LUNGS:  Clear to auscultation bilaterally with no wheezes, rhonchi or rales noted.  Respiratory was nonlabored. ABDOMEN:  Soft, nontender, nondistended with active bowel sounds.  She does complain of only minimal tenderness in the left lower quadrant, but has absolutely no guarding, rebounding  or peritoneal signs.  She definitely does not have a positive Murphy sign.  No hernias or organomegaly are noted.  She does have a lower midline scar from a prior hysterectomy. MUSCULOSKELETAL:  All 4 extremities are symmetrical with no cyanosis, clubbing or edema, however, they are quite atrophic. NEURO:  Cranial nerves II through XII appear to be grossly intact.  Deep tendon reflex exam is deferred at this time, however, the patient does have a stutter which may be secondary to her prior CVA. SKIN:  Warm and dry with no masses, lesions or rashes.  LABS AND DIAGNOSTICS:  Cardiac panel is negative.  Sodium 143, potassium 4.8, glucose 114, BUN 15, creatinine 0.88, INR is 2.03, total bilirubin 0.5, alkaline phosphatase 95, AST 25, ALT 17, white blood cell count 7100, hemoglobin 13.4, hematocrit 38.8, platelet count 296,000.  Diagnostic, ultrasound of the abdomen reveals cholelithiasis without evidence of biliary obstruction or overt cholecystitis.  IMPRESSION: 1. Chest pain. 2. Hypertension. 3. Cholelithiasis. 4. Breast cancer. 5. Dementia. 6. History of cerebrovascular accident. 7. History of endocarditis with embolic stroke.  PLAN:  Currently, the patient's story is not very consistent with classic biliary colic.  It is possible that her pain was initially related to biliary colic, but she is currently asymptomatic without any abdominal pain.  When asked where her pain is she still points to her upper chest.  The patient does have several contributing factors that may have caused some of her nausea as well as her history of poor p.o. intake such as her dementia, failure to thrive and resumption of Fosamax which tends to cause the patient's GI upset and was the reason for this being held in the first place.  Given that the patient is not a very good surgical candidate given the fact she is on her Coumadin as well as her past medical history, she would have to prove that this  incident was definitely her gallbladder for any type of surgical intervention would be entertained.  If this is secondary to her gallstones, the patient is currently asymptomatic and would still not necessarily require any type of urgent surgical intervention.  I will discuss this patient with Dr. Carolynne Edouard and we will follow with you.     Letha Cape, PA   ______________________________ Ollen Gross. Vernell Morgans, M.D.    KEO/MEDQ  D:  09/04/2011  T:  09/04/2011  Job:  [id number]  cc:   Marinda Elk, M.D. Bruce Rexene Edison Swords, MD  Electronically Signed by Barnetta Chapel PA on 09/08/2011 01:29:12 PM Electronically Signed by Chevis Pretty III M.D. on 09/11/2011 09:50:14 AM

## 2011-09-15 NOTE — Telephone Encounter (Signed)
See me this week

## 2011-09-19 ENCOUNTER — Encounter: Payer: Self-pay | Admitting: Internal Medicine

## 2011-09-19 ENCOUNTER — Ambulatory Visit (INDEPENDENT_AMBULATORY_CARE_PROVIDER_SITE_OTHER): Payer: Medicare Other | Admitting: Internal Medicine

## 2011-09-19 DIAGNOSIS — Z23 Encounter for immunization: Secondary | ICD-10-CM

## 2011-09-19 DIAGNOSIS — E785 Hyperlipidemia, unspecified: Secondary | ICD-10-CM

## 2011-09-19 DIAGNOSIS — I2699 Other pulmonary embolism without acute cor pulmonale: Secondary | ICD-10-CM

## 2011-09-19 DIAGNOSIS — I509 Heart failure, unspecified: Secondary | ICD-10-CM

## 2011-09-19 DIAGNOSIS — I10 Essential (primary) hypertension: Secondary | ICD-10-CM

## 2011-09-19 LAB — BASIC METABOLIC PANEL
BUN: 12 mg/dL (ref 6–23)
Chloride: 102 mEq/L (ref 96–112)
Glucose, Bld: 78 mg/dL (ref 70–99)
Potassium: 4.1 mEq/L (ref 3.5–5.1)

## 2011-09-19 NOTE — Patient Instructions (Signed)
  Latest dosing instructions   Total Sun Mon Tue Wed Thu Fri Sat   22.5 5 mg 2.5 mg 2.5 mg 5 mg 2.5 mg 2.5 mg 2.5 mg    (5 mg1) (5 mg0.5) (5 mg0.5) (5 mg1) (5 mg0.5) (5 mg0.5) (5 mg0.5)

## 2011-09-24 DIAGNOSIS — M069 Rheumatoid arthritis, unspecified: Secondary | ICD-10-CM

## 2011-09-24 DIAGNOSIS — M6281 Muscle weakness (generalized): Secondary | ICD-10-CM

## 2011-09-24 DIAGNOSIS — I10 Essential (primary) hypertension: Secondary | ICD-10-CM

## 2011-09-30 NOTE — Assessment & Plan Note (Signed)
Clinically no sxs. Will continue current therapy

## 2011-09-30 NOTE — Assessment & Plan Note (Signed)
Lab Results  Component Value Date   CHOL 323* 12/22/2008   CHOL 252* 08/09/2007   Lab Results  Component Value Date   HDL 132.3 12/22/2008   HDL 123.1 08/09/2007   No results found for this basename: [facility]   Lab Results  Component Value Date   TRIG 79 12/22/2008   TRIG 95 08/09/2007   Lab Results  Component Value Date   CHOLHDL 2.4 CALC 12/22/2008   CHOLHDL 2.0 CALC 08/09/2007   Lab Results  Component Value Date   LDLDIRECT 160.1 12/22/2008   LDLDIRECT 108.3 08/09/2007   She has been intolerant to meds.  She understands the risks of untreated hyperlipidemia (recurrent CV/CNS events)

## 2011-09-30 NOTE — Progress Notes (Signed)
  Subjective:    Patient ID: Alyssa Waters, female    DOB: [birth year], 77 y.o.   MRN: [id number]  HPI  Patient Active Problem List  Diagnoses  .   [name] HYPERLIPIDEMIA--not on any meds  . ANXIETY  . DEPRESSION--tolerating meds  . HYPERTENSION--no home bps but compliant with meds  . CAD=--no sxs of CP, SOB  . ENDOCARDITIS---no fevers, chills, sweats,   . CHF---no pnd/orthopnea  . CVA---no new neurologic defeicits  . DEEP VENOUS THROMBOPHLEBITIS, CHRONIC---on chronic warfarin  . GERD---not requiring any meds  .   .   .   .   .   .   .   . PE (pulmonary embolism)---on warfarin   Past Medical History  Diagnosis Date  . HYPERTENSION 08/09/2007  . GERD (gastroesophageal reflux disease)   . Breast cancer   . Rheumatoid arthritis   . Stroke   . Endocarditis   . PE (pulmonary embolism)   . Hyperlipidemia    Past Surgical History  Procedure Date  . Abdominal hysterectomy   . Tubal ligation   . Breast lumpectomy 2007    right  . Tonsillectomy   . Varicose vein surgery 1971  . Rotator cuff repair 1986    right  . Rectocele repair 1998  . Peg tube placement 04/2002    reports that she has quit smoking. She does not have any smokeless tobacco history on file. She reports that she drinks alcohol. Her drug history not on file. family history includes Cancer in her mother and Heart attack in her father. No Known Allergies   Review of Systems  patient denies chest pain, shortness of breath, orthopnea. Denies lower extremity edema, abdominal pain, change in appetite, change in bowel movements. Patient denies rashes, musculoskeletal complaints. No other specific complaints in a complete review of systems.      Objective:   Physical Exam  Well-developed well-nourished female in no acute distress. HEENT exam atraumatic, normocephalic, extraocular muscles are intact. Neck is supple. No jugular venous distention no thyromegaly. Chest clear to auscultation without increased work of  breathing. Cardiac exam S1 and S2 are regular. Abdominal exam active bowel sounds, soft, nontender. Extremities no edema       Assessment & Plan:

## 2011-09-30 NOTE — Assessment & Plan Note (Signed)
No bleeding complications on warfarin

## 2011-09-30 NOTE — Assessment & Plan Note (Signed)
BP Readings from Last 3 Encounters:  09/19/11 152/84  06/16/11 138/90  05/16/11 126/90   Tolerating meds---she will monitor bp at home

## 2011-10-06 ENCOUNTER — Telehealth: Payer: Self-pay | Admitting: *Deleted

## 2011-10-06 NOTE — Telephone Encounter (Signed)
Caller states that they were informed that after lab results came in that they would be contacted to schedule F/U office visit to better coordinate patient's medical care w/fewer doctors involved. Caller states that Dr. Maryellen Pile is wanting patient to f/u with labs and they are wanting to know if patient is to see Dr Maryellen Pile or Dr Cato Mulligan. Caller also wanted to inform us that patient's BP was 160/89 yesterday.

## 2011-10-08 ENCOUNTER — Other Ambulatory Visit: Payer: Self-pay | Admitting: Internal Medicine

## 2011-10-09 NOTE — Telephone Encounter (Signed)
See me in 2-3 months

## 2011-10-09 NOTE — Telephone Encounter (Signed)
Pt aware to call make an OV

## 2011-10-13 ENCOUNTER — Inpatient Hospital Stay: Admission: RE | Admit: 2011-10-13 | Payer: Medicare Other | Source: Ambulatory Visit

## 2011-10-30 ENCOUNTER — Telehealth: Payer: Self-pay

## 2011-10-30 NOTE — Telephone Encounter (Signed)
[facility] Triage Call Report Triage Record Num: [id number] Operator: Ether Griffins Patient Name: Alyssa Waters Call Date & Time: 10/29/2011 5:46:37PM Patient Phone: [phone] PCP: Valetta Mole. Swords Patient Gender: Female PCP Fax : [phone] Patient DOB: 07-Sep-1931 [facility] Name: [facility] Reason for Call: Caller: Joyce Gross; PCP: Birdie Sons; CB#: [phone]; Call Reason: BP:174/82 HR: 85 @ 1645 today, and now 169/81. Sx Onset: 10/29/2011. Sx Notes: Normally runs 150's. Feels nauseous but otherwiiise feels fine.Was hospitalized 1 month ago for HTN(BP in 200's) Home treatment(s) tried: Clonidine; Did home treatment help?: No.All emergent sxs of Hypertension, diagnosed or suspected r/o.Advised to call office to schedule an appt within 72 hrs.Also advised to continue to monitor BP and keep a log of readings. Protocol(s) Used: Hypertension, Diagnosed or Suspected Recommended Outcome per Protocol: See Provider within 72 Hours Reason for Outcome: Multiple elevated blood pressure readings without other symptoms AND no previous work-up OR readings exceed expected range defined by treatment plan Care Advice: Call EMS 911 if new symptoms develop, such as severe shortness of breath, chest pain, change in mental status, acute neurologic deficit, seizure, visual disturbances, pulse rate > 120 / minute, or very irregular pulse. ~ It is important to have blood pressure checked at least annually, or at each provider visit, especially if you have a history of high blood pressure in your family. ~ ~ Call provider if systolic BP is 180 or greater, or if diastolic BP is 120 or greater. ~ HEALTH PROMOTION / MAINTENANCE Avoid the use of stimulants including caffeine (coffee, some soft drinks, some energy drinks, tea and chocolate), cocaine, and amphetamines. Also avoid drinking alcohol. ~ ~ CAUTIONS ~ List, or take, all current prescription(s), nonprescription or alternative  medication(s) to provider for evaluation. LIFESTYLE MODIFICATION FOR HYPERTENSION: - Weight reduction will help lower blood pressure in individuals who are 10% or more above their ideal body weight. - Eat foods low in saturated fat and sugar, and high in complex carbohydrates (vegetables, fruits, whole grains). - Drink alcohol only in moderate amounts (12 oz beer, 5 oz wine, or 1 1/2 oz of distilled alcohol such as vodka, gin, etc.) and not more than 2 drinks/day for men or 1 drink/day for women or lighter-weight persons. - Get at least 30 minutes of moderate aerobic exercise (using as much energy as walking 2 miles in 30 minutes) most days of the week, preferably daily. - Stop smoking to decrease risk for cardiovascular and pulmonary disease, as well as cancer. - Keep regularly scheduled appointments with your provider. ~ Medication Advice: - Discontinue all nonprescription and alternative medications, especially stimulants, until evaluated by provider. - Take prescribed medications as directed, following label instructions for the medication. - Do not change medications or dosing regimen until provider is consulted. - Know possible side effects of medication and what to do if they occur. - Tell provider all prescription, nonprescription or alternative medications that you take ~

## 2011-10-31 ENCOUNTER — Ambulatory Visit (INDEPENDENT_AMBULATORY_CARE_PROVIDER_SITE_OTHER): Payer: Medicare Other | Admitting: Internal Medicine

## 2011-10-31 ENCOUNTER — Encounter: Payer: Self-pay | Admitting: Internal Medicine

## 2011-10-31 VITALS — BP 140/88 | HR 91 | Temp 97.9°F

## 2011-10-31 DIAGNOSIS — Z862 Personal history of diseases of the blood and blood-forming organs and certain disorders involving the immune mechanism: Secondary | ICD-10-CM

## 2011-10-31 DIAGNOSIS — I2510 Atherosclerotic heart disease of native coronary artery without angina pectoris: Secondary | ICD-10-CM

## 2011-10-31 DIAGNOSIS — I509 Heart failure, unspecified: Secondary | ICD-10-CM

## 2011-10-31 DIAGNOSIS — Z7901 Long term (current) use of anticoagulants: Secondary | ICD-10-CM | POA: Insufficient documentation

## 2011-10-31 DIAGNOSIS — M069 Rheumatoid arthritis, unspecified: Secondary | ICD-10-CM

## 2011-10-31 DIAGNOSIS — I10 Essential (primary) hypertension: Secondary | ICD-10-CM

## 2011-10-31 DIAGNOSIS — R413 Other amnesia: Secondary | ICD-10-CM | POA: Insufficient documentation

## 2011-10-31 LAB — CBC WITH DIFFERENTIAL/PLATELET
Basophils Absolute: 0 10*3/uL (ref 0.0–0.1)
HCT: 43.6 % (ref 36.0–46.0)
Lymphs Abs: 1.6 10*3/uL (ref 0.7–4.0)
MCHC: 32.7 g/dL (ref 30.0–36.0)
MCV: 88.7 fl (ref 78.0–100.0)
Monocytes Absolute: 0.7 10*3/uL (ref 0.1–1.0)
Platelets: 262 10*3/uL (ref 150.0–400.0)
RDW: 14.7 % — ABNORMAL HIGH (ref 11.5–14.6)

## 2011-10-31 LAB — BASIC METABOLIC PANEL
Calcium: 9.6 mg/dL (ref 8.4–10.5)
GFR: 59.83 mL/min — ABNORMAL LOW (ref >60.00)
Sodium: 143 mEq/L (ref 135–145)

## 2011-10-31 NOTE — Telephone Encounter (Signed)
Call and check BPs Schedule appt with any provider

## 2011-10-31 NOTE — Telephone Encounter (Signed)
LMTCB

## 2011-10-31 NOTE — Patient Instructions (Addendum)
Should  Not take ibuprofen for pain because can put her at risk for bleeding and interfere with kidneys.    Will notify you  of labs when available. Notice if bp elevated could be from pain or  Close to when she is due for   Next clonidine. Will notify you  of labs when available. No other change in meds  For now. Write down   bp readings   ROV and see dr Cato Mulligan in about 2 weeks with readings.   For further management.    Latest dosing instructions   Total [name] Tue Wed Thu Fri Sat   30 2.5 mg 5 mg 5 mg 2.5 mg 5 mg 5 mg 5 mg    (5 mg0.5) (5 mg1) (5 mg1) (5 mg0.5) (5 mg1) (5 mg1) (5 mg1)

## 2011-10-31 NOTE — Progress Notes (Signed)
Subjective:    Patient ID: Alyssa Waters, female    DOB: [birth year], 77 y.o.   MRN: [id number]  HPI Patient comes in today as a walk-in with her daughter who last pm had called the call service this week regarding elevated blood pressure readings. She was told to come in for appointment and there was some confusion. Son  declined appointment daughter brought her in to work in to the clinic schedule. Dr. Cato Mulligan reviewed the final note and recommended she be seen.  Alyssa Waters is at home and lives with her son however her daughter comes and visits and helps out with her health care. She has multiple medical problems and was hospitalized in September of this year with chest/ epigastric pain acute acute delerium.  renal insufficiency felt to be from overaggressive bp treatment , accelerated hypertension alternating with hypotension. She was noted to have chronic iron deficiency anemia rheumatoid arthritis history of endocarditis. She also has some memory problems. She is on chronic anticoagulation for a history of pulmonary emboli.  She last saw Dr. Cato Mulligan about a month ago. At that time she was stable and daughter was told to monitor her blood pressure readings. Goal was between 140 and 150 but the cough it was extremely high. She checks her blood pressure  About 3-4 days per week  Usually about 146 or so.  However recently she got some readings in the 180 /170 range. According to the daughter there was otherwise no change in her health status such as fever increasing shortness of breath cough edema. no history of a fall.  No chang e in clinical status but some decrease appetite.  There was some question of whether she feels weak but she really does not have to change.  She takes hydrocodone Vicodin for pain and daughter called the brother at home about use of pain medication. It sounds like she takes 1 or 2 a day. On further questioning it appears that she is taking 2 ibuprofen in the evening also  despite the fact that she is on Coumadin and aspirin. Her medicines are put out by a family member there is also a caretaker that comes and helps. Daughter thinks that the symptoms are taken correctly and is not aware of any problems with this. She is also on low-dose prednisone for her arthritis.  Review of Systems Negative for new shortness of breath fever UTI symptoms change in bowel habits ulcers bleeding. Daughter says her memory is not so good. She is able to walk some but is mostly a wheelchair. There is been no change in her Coumadin dose.    Objective:   Physical Exam Slender but well-developed  But frail alert elderly lady in no acute distress sitting in a wheelchair. She is verbal and interactive HEENT is atraumatic eyes slightly red but no discharge nares patent OP clear. She has right facial paresthesias presumed from her previous Bell's palsy. Or CVA Neck without masses or JVD Chest:  Clear to A&P without wheezes rales or rhonchi CV:  S1-S2 no gallops or murmurs peripheral perfusion is normal No clubbing cyanosis or edema BP readings  By provider  Right reg 120/80 right ;left  116/78    Daughters machine 129/80 left arm  Extremities arthritic changes but no redness or acute findings. Abdomen is grossly normal  and nontender to palpation SKIN; no obvious significant bruising and bleeding.  Reviewed ehr  Pharmacy called  To confirm medications that she has been on.  Assessment & Plan:  History of labile blood pressure readings  Apparent history of same.  Readings today are normal. I agree would not want to be over aggressive with her blood pressure and cause side effects. We discussed possibility of pain causing elevated blood pressure ,time of day the clonidine twice a day he is wearing off or missed medications or interactions.  concern that she is taking ibuprofen ,aspirin ,and Coumadin with her history of anemia and renal insufficiency. Discussed stopping the  ibuprofen she can try Tylenol max 2400 mg a day.  Today we'll check chemistry renal function CBC   And INR  Disc  strategies to ensure medication accuracy. Family helps take care of Alyssa Waters which is greatfor support but encouraged communication to be accurate.  We'll have her continue to take blood pressure readings but note if she is in pain or if it is close to next dose in for her clonidine and make sure that she has rested for a couple minutes before taking the reading.  Certainly if she gets any symptoms to call back otherwise come in with this information and see Dr. Cato Mulligan in 2-3 weeks.  Do not suspect any decompensation of her  Numerous chronic medical illness.  INR is slightly low today 1.6 we'll take an extra 5 mg today and then back to her regular schedule as apparently this has been stable   Total visit 45 mins > 50% spent counseling and coordinating care

## 2011-10-31 NOTE — Telephone Encounter (Signed)
Pts husband stated that pt could not be seen today (I offered appt with Dr Fabian Sharp at 2:30) but he wanted me to call daughter to have her check bp.  Called daughter and l/m to check pts BP and call me back with reading.  They showed up to be seen.  Pt was put on Dr Rosezella [place] schedule

## 2011-11-03 ENCOUNTER — Other Ambulatory Visit: Payer: Self-pay | Admitting: Internal Medicine

## 2011-11-03 DIAGNOSIS — E876 Hypokalemia: Secondary | ICD-10-CM

## 2011-11-07 ENCOUNTER — Other Ambulatory Visit (INDEPENDENT_AMBULATORY_CARE_PROVIDER_SITE_OTHER): Payer: Medicare Other

## 2011-11-07 DIAGNOSIS — E876 Hypokalemia: Secondary | ICD-10-CM

## 2011-11-07 LAB — BASIC METABOLIC PANEL
BUN: 23 mg/dL (ref 6–23)
CO2: 27 mEq/L (ref 19–32)
Calcium: 9.1 mg/dL (ref 8.4–10.5)
Chloride: 106 mEq/L (ref 96–112)
Creatinine, Ser: 1 mg/dL (ref 0.4–1.2)
GFR: 70.51 mL/min (ref >60.00)
Glucose, Bld: 105 mg/dL — ABNORMAL HIGH (ref 70–99)
Potassium: 3.2 mEq/L — ABNORMAL LOW (ref 3.5–5.1)
Sodium: 144 mEq/L (ref 135–145)

## 2011-11-13 NOTE — Progress Notes (Signed)
Quick Note:  Pt aware of results. ______ 

## 2011-11-17 ENCOUNTER — Ambulatory Visit (INDEPENDENT_AMBULATORY_CARE_PROVIDER_SITE_OTHER): Payer: Medicare Other | Admitting: Internal Medicine

## 2011-11-17 ENCOUNTER — Encounter: Payer: Self-pay | Admitting: Internal Medicine

## 2011-11-17 DIAGNOSIS — R634 Abnormal weight loss: Secondary | ICD-10-CM

## 2011-11-17 DIAGNOSIS — Z23 Encounter for immunization: Secondary | ICD-10-CM

## 2011-11-17 DIAGNOSIS — I10 Essential (primary) hypertension: Secondary | ICD-10-CM

## 2011-11-17 DIAGNOSIS — Z8673 Personal history of transient ischemic attack (TIA), and cerebral infarction without residual deficits: Secondary | ICD-10-CM

## 2011-11-17 DIAGNOSIS — I2699 Other pulmonary embolism without acute cor pulmonale: Secondary | ICD-10-CM

## 2011-11-17 DIAGNOSIS — I6350 Cerebral infarction due to unspecified occlusion or stenosis of unspecified cerebral artery: Secondary | ICD-10-CM

## 2011-11-17 LAB — POCT INR: INR: 3.8

## 2011-11-17 LAB — BASIC METABOLIC PANEL
CO2: 29 mEq/L (ref 19–32)
Chloride: 104 mEq/L (ref 96–112)
Creatinine, Ser: 1 mg/dL (ref 0.4–1.2)
Potassium: 3 mEq/L — ABNORMAL LOW (ref 3.5–5.1)

## 2011-11-17 NOTE — Progress Notes (Signed)
  Subjective:    Patient ID: Alyssa Waters, female    DOB: 1931/06/16, 77 y.o.   MRN: [id number]  HPI Multiple medical problems- Dtr is with pt today. States that pt's son is responsible for medications and that patient does not know her current meds.  Pt and dtr note decreased appetite. ---note gradual weight loss. Dtr reports that after stroke patient requirded feeding tube.  Wt Readings from Last 3 Encounters:  11/17/11 107 lb (48.535 kg)  09/19/11 112 lb (50.803 kg)  06/16/11 116 lb (52.617 kg)    Patient has a history of a stroke. No recurrent problems.  Patient with history of coronary artery disease recent chest pain, shortness of breath.  Patient has a history of endocarditis. She denies any fever, chills, sweats.  Past Medical History  Diagnosis Date  . HYPERTENSION 08/09/2007  . GERD (gastroesophageal reflux disease)   . Breast cancer   . Rheumatoid arthritis   . Stroke   . Endocarditis   . PE (pulmonary embolism)   . Hyperlipidemia    Past Surgical History  Procedure Date  . Abdominal hysterectomy   . Tubal ligation   . Breast lumpectomy 2007    right  . Tonsillectomy   . Varicose vein surgery 1971  . Rotator cuff repair 1986    right  . Rectocele repair 1998  . Peg tube placement 04/2002    reports that she has quit smoking. She does not have any smokeless tobacco history on file. She reports that she drinks alcohol. Her drug history not on file. family history includes Cancer in her mother and Heart attack in her father. No Known Allergies   Review of Systems     patient denies chest pain, shortness of breath, orthopnea. Denies lower extremity edema, abdominal pain, change in appetite, change in bowel movements. Patient denies rashes, musculoskeletal complaints. No other specific complaints in a complete review of systems.   Objective:   Physical Exam Frail, elderly female in no acute distress. HEENT exam atraumatic, normocephalic and extraocular chest  clear to auscultation cardiac exam S1 and S2 are regular. Abdominal exam thin, pleasant, soft extremities no edema. Neurologic exam she is walking with a walker.       Assessment & Plan:

## 2011-11-17 NOTE — Patient Instructions (Signed)
  Latest dosing instructions   Total Alyssa Waters Tue Wed Thu Fri Sat   25 2.5 mg 2.5 mg 5 mg 2.5 mg 5 mg 2.5 mg 5 mg    (5 mg0.5) (5 mg0.5) (5 mg1) (5 mg0.5) (5 mg1) (5 mg0.5) (5 mg1)

## 2011-11-17 NOTE — Assessment & Plan Note (Signed)
Note 20 pound weight loss in past 18 months Refer nutrition and to SLP for evaluation

## 2011-11-20 NOTE — Assessment & Plan Note (Signed)
No recurrent symptoms. Risk factor modification is the plan.

## 2011-11-20 NOTE — Assessment & Plan Note (Signed)
BP Readings from Last 3 Encounters:  11/17/11 124/82  10/31/11 140/88  09/19/11 152/84   fair control. Continue current medications.

## 2011-11-21 ENCOUNTER — Other Ambulatory Visit: Payer: Self-pay | Admitting: *Deleted

## 2011-11-21 MED ORDER — POTASSIUM CHLORIDE 20 MEQ PO PACK: 20.0000 meq | PACK | Freq: Every day | ORAL | Status: DC

## 2011-11-24 ENCOUNTER — Ambulatory Visit (INDEPENDENT_AMBULATORY_CARE_PROVIDER_SITE_OTHER): Payer: Medicare Other | Admitting: Internal Medicine

## 2011-11-24 DIAGNOSIS — I2699 Other pulmonary embolism without acute cor pulmonale: Secondary | ICD-10-CM

## 2011-11-24 LAB — POCT INR: INR: 2

## 2011-11-24 NOTE — Patient Instructions (Signed)
  Latest dosing instructions   Total Sun Mon Tue Wed Thu Fri Sat   25 2.5 mg 2.5 mg 5 mg 2.5 mg 5 mg 2.5 mg 5 mg    (5 mg0.5) (5 mg0.5) (5 mg1) (5 mg0.5) (5 mg1) (5 mg0.5) (5 mg1)        

## 2011-12-02 ENCOUNTER — Other Ambulatory Visit: Payer: Self-pay | Admitting: *Deleted

## 2011-12-02 ENCOUNTER — Telehealth: Payer: Self-pay | Admitting: *Deleted

## 2011-12-02 DIAGNOSIS — C50319 Malignant neoplasm of lower-inner quadrant of unspecified female breast: Secondary | ICD-10-CM

## 2011-12-02 DIAGNOSIS — C50919 Malignant neoplasm of unspecified site of unspecified female breast: Secondary | ICD-10-CM

## 2011-12-02 MED ORDER — GABAPENTIN 300 MG PO CAPS: 300.0000 mg | ORAL_CAPSULE | Freq: Every evening | ORAL | Status: DC | PRN

## 2011-12-02 NOTE — Telephone Encounter (Signed)
NO NOTE

## 2011-12-05 ENCOUNTER — Ambulatory Visit: Payer: PRIVATE HEALTH INSURANCE | Attending: Internal Medicine

## 2011-12-05 DIAGNOSIS — I69992 Facial weakness following unspecified cerebrovascular disease: Secondary | ICD-10-CM | POA: Insufficient documentation

## 2011-12-05 DIAGNOSIS — IMO0001 Reserved for inherently not codable concepts without codable children: Secondary | ICD-10-CM | POA: Insufficient documentation

## 2011-12-11 ENCOUNTER — Ambulatory Visit (INDEPENDENT_AMBULATORY_CARE_PROVIDER_SITE_OTHER)
Admission: RE | Admit: 2011-12-11 | Discharge: 2011-12-11 | Disposition: A | Discharge status: Home / Self Care | Payer: PRIVATE HEALTH INSURANCE | Source: Ambulatory Visit

## 2011-12-11 DIAGNOSIS — Z13820 Encounter for screening for osteoporosis: Secondary | ICD-10-CM

## 2011-12-11 DIAGNOSIS — C50919 Malignant neoplasm of unspecified site of unspecified female breast: Secondary | ICD-10-CM

## 2011-12-12 ENCOUNTER — Other Ambulatory Visit: Payer: Self-pay | Admitting: Internal Medicine

## 2011-12-23 ENCOUNTER — Ambulatory Visit: Payer: PRIVATE HEALTH INSURANCE

## 2011-12-23 DIAGNOSIS — Z5181 Encounter for therapeutic drug level monitoring: Secondary | ICD-10-CM

## 2011-12-23 DIAGNOSIS — I2699 Other pulmonary embolism without acute cor pulmonale: Secondary | ICD-10-CM

## 2011-12-23 DIAGNOSIS — Z7901 Long term (current) use of anticoagulants: Secondary | ICD-10-CM

## 2011-12-23 LAB — POCT INR: INR: 3

## 2011-12-23 NOTE — Patient Instructions (Signed)
  Latest dosing instructions   Total Sun Mon Tue Wed Thu Fri Sat   25 2.5 mg 2.5 mg 5 mg 2.5 mg 5 mg 2.5 mg 5 mg    (5 mg0.5) (5 mg0.5) (5 mg1) (5 mg0.5) (5 mg1) (5 mg0.5) (5 mg1)        

## 2011-12-24 ENCOUNTER — Other Ambulatory Visit: Payer: Self-pay | Admitting: Internal Medicine

## 2011-12-26 ENCOUNTER — Encounter: Payer: Self-pay | Admitting: Internal Medicine

## 2012-01-14 ENCOUNTER — Ambulatory Visit (INDEPENDENT_AMBULATORY_CARE_PROVIDER_SITE_OTHER): Payer: PRIVATE HEALTH INSURANCE | Admitting: Family Medicine

## 2012-01-14 ENCOUNTER — Encounter: Payer: Self-pay | Admitting: Family Medicine

## 2012-01-14 VITALS — BP 152/90 | Temp 98.1°F | Wt 120.0 lb

## 2012-01-14 DIAGNOSIS — M069 Rheumatoid arthritis, unspecified: Secondary | ICD-10-CM

## 2012-01-14 DIAGNOSIS — R600 Localized edema: Secondary | ICD-10-CM

## 2012-01-14 NOTE — Progress Notes (Signed)
  Subjective:    Patient ID: Alyssa Waters, female    DOB: 02/16/1931, 78 y.o.   MRN: [id number]  HPI  Acute visit. Left hand edema off and on for 2 weeks. She has a long history of rheumatoid arthritis. Currently only takes prednisone 5 mg daily. Appointment with rheumatologist next week. No injury. No fever or chills. No increased redness. She's had similar swelling off and on in the past. Her other chronic problems include history of hypertension, CAD, CHF, history of CVA, history of DVT, history of breast cancer, questionable history of gout, and pulmonary embolus. She takes chronic Coumadin.   Review of Systems  Constitutional: Negative for fever and chills.  Respiratory: Negative for cough and shortness of breath.   Musculoskeletal: Positive for joint swelling.       Objective:   Physical Exam  Constitutional:       Thin somewhat frail 78 year old female  Cardiovascular:       Irregular heart rhythm with rate around 80  Pulmonary/Chest: Effort normal and breath sounds normal. No respiratory distress. She has no wheezes. She has no rales.  Musculoskeletal:       Left hand reveals edema mostly involving metacarpophalangeal joints. She has some mild edema involving several PIP joints. There is no warmth or erythema. Nontender to palpation. No significant wrist edema. She has some mild to moderate deformities of the hand from chronic rheumatoid arthritis. Normal distal wrist pulses  Good cap refill.          Assessment & Plan:  Left hand edema. Probably related to rheumatoid arthritis. No evidence for infection such as cellulitis. Bump prednisone to 20 mg daily for the next 5 days and taper back. She has appointment with rheumatologist next week.  Just had lab work earlier today per rheumatologist

## 2012-01-14 NOTE — Patient Instructions (Signed)
Increase prednisone to 4 tablets daily for 5 days then resume usual dose of prednisone. Follow up immediately for any increased redness or warmth.

## 2012-01-17 ENCOUNTER — Other Ambulatory Visit: Payer: Self-pay | Admitting: Internal Medicine

## 2012-01-20 ENCOUNTER — Ambulatory Visit: Payer: PRIVATE HEALTH INSURANCE

## 2012-01-20 DIAGNOSIS — I2699 Other pulmonary embolism without acute cor pulmonale: Secondary | ICD-10-CM

## 2012-01-20 DIAGNOSIS — Z5181 Encounter for therapeutic drug level monitoring: Secondary | ICD-10-CM

## 2012-01-20 DIAGNOSIS — Z7901 Long term (current) use of anticoagulants: Secondary | ICD-10-CM

## 2012-01-20 LAB — POCT INR: INR: 1.5

## 2012-01-20 NOTE — Patient Instructions (Signed)
  Latest dosing instructions   Total Sun Mon Tue Wed Thu Fri Sat   27.5 5 mg 2.5 mg 5 mg 2.5 mg 5 mg 2.5 mg 5 mg    (5 mg1) (5 mg0.5) (5 mg1) (5 mg0.5) (5 mg1) (5 mg0.5) (5 mg1)        

## 2012-02-06 ENCOUNTER — Other Ambulatory Visit: Payer: Self-pay | Admitting: Internal Medicine

## 2012-02-17 ENCOUNTER — Ambulatory Visit: Payer: PRIVATE HEALTH INSURANCE

## 2012-02-17 DIAGNOSIS — Z5181 Encounter for therapeutic drug level monitoring: Secondary | ICD-10-CM

## 2012-02-17 DIAGNOSIS — I2699 Other pulmonary embolism without acute cor pulmonale: Secondary | ICD-10-CM

## 2012-02-17 DIAGNOSIS — Z7901 Long term (current) use of anticoagulants: Secondary | ICD-10-CM

## 2012-02-17 NOTE — Patient Instructions (Signed)
  Latest dosing instructions   Total Sun Mon Tue Wed Thu Fri Sat   27.5 5 mg 2.5 mg 5 mg 2.5 mg 5 mg 2.5 mg 5 mg    (5 mg1) (5 mg0.5) (5 mg1) (5 mg0.5) (5 mg1) (5 mg0.5) (5 mg1)        

## 2012-02-25 ENCOUNTER — Other Ambulatory Visit: Payer: Self-pay | Admitting: Internal Medicine

## 2012-03-03 ENCOUNTER — Ambulatory Visit (INDEPENDENT_AMBULATORY_CARE_PROVIDER_SITE_OTHER): Payer: PRIVATE HEALTH INSURANCE | Admitting: Internal Medicine

## 2012-03-03 DIAGNOSIS — I2699 Other pulmonary embolism without acute cor pulmonale: Secondary | ICD-10-CM

## 2012-03-03 NOTE — Patient Instructions (Signed)
  Latest dosing instructions   Total Sun Mon Tue Wed Thu Fri Sat   27.5 5 mg 2.5 mg 5 mg 2.5 mg 5 mg 2.5 mg 5 mg    (5 mg1) (5 mg0.5) (5 mg1) (5 mg0.5) (5 mg1) (5 mg0.5) (5 mg1)        

## 2012-03-11 ENCOUNTER — Ambulatory Visit: Payer: PRIVATE HEALTH INSURANCE | Admitting: Internal Medicine

## 2012-03-11 ENCOUNTER — Telehealth: Payer: Self-pay | Admitting: Internal Medicine

## 2012-03-11 NOTE — Telephone Encounter (Signed)
Pt daughter called back again. Please return call

## 2012-03-11 NOTE — Telephone Encounter (Signed)
Pt cancelled follow up for today for her coumadin levels. Pt is still scheduled to come in Monday to get her levels checked again. Pt daughter requesting you contact he about this appt because she did not make it to todays appt

## 2012-03-11 NOTE — Telephone Encounter (Signed)
Left message for pt to call back  °

## 2012-03-11 NOTE — Telephone Encounter (Signed)
appt scheduled for Tuesday.  °

## 2012-03-15 ENCOUNTER — Ambulatory Visit: Payer: PRIVATE HEALTH INSURANCE

## 2012-03-16 ENCOUNTER — Ambulatory Visit (INDEPENDENT_AMBULATORY_CARE_PROVIDER_SITE_OTHER): Payer: PRIVATE HEALTH INSURANCE | Admitting: Internal Medicine

## 2012-03-16 ENCOUNTER — Ambulatory Visit: Payer: PRIVATE HEALTH INSURANCE

## 2012-03-16 VITALS — BP 144/86 | HR 88

## 2012-03-16 DIAGNOSIS — I6350 Cerebral infarction due to unspecified occlusion or stenosis of unspecified cerebral artery: Secondary | ICD-10-CM

## 2012-03-16 DIAGNOSIS — E876 Hypokalemia: Secondary | ICD-10-CM

## 2012-03-16 DIAGNOSIS — I10 Essential (primary) hypertension: Secondary | ICD-10-CM

## 2012-03-16 DIAGNOSIS — I2699 Other pulmonary embolism without acute cor pulmonale: Secondary | ICD-10-CM

## 2012-03-16 LAB — BASIC METABOLIC PANEL
BUN: 18 mg/dL (ref 6–23)
Chloride: 103 mEq/L (ref 96–112)
GFR: 65.78 mL/min (ref >60.00)
Potassium: 3.2 mEq/L — ABNORMAL LOW (ref 3.5–5.1)
Sodium: 143 mEq/L (ref 135–145)

## 2012-03-16 NOTE — Assessment & Plan Note (Signed)
No recurrence. 

## 2012-03-16 NOTE — Assessment & Plan Note (Signed)
i have asked for accurate medication list  Reviewed labs. Note hypokalemia Check labs today

## 2012-03-16 NOTE — Assessment & Plan Note (Signed)
protime today

## 2012-03-16 NOTE — Progress Notes (Signed)
Patient ID: Alyssa Waters, female   DOB: 1931-05-23, 78 y.o.   MRN: [id number] i have reviewedmedications with patient and son---difficult to obtain accurate history from them  htn---she states she is tolerating meds  RA---low dose prednisone  PE---needs lifelong warfarin  Past Medical History  Diagnosis Date  . HYPERTENSION 08/09/2007  . GERD (gastroesophageal reflux disease)   . Breast cancer   . Rheumatoid arthritis   . Stroke   . Endocarditis   . PE (pulmonary embolism)   . Hyperlipidemia     History   Social History  . Marital Status: Divorced    Spouse Name: N/A    Number of Children: N/A  . Years of Education: N/A   Occupational History  . Not on file.   Social History Main Topics  . Smoking status: Former Games developer  . Smokeless tobacco: Not on file  . Alcohol Use: Yes  . Drug Use:   . Sexually Active:    Other Topics Concern  . Not on file   Social History Narrative   Lives with somDaughter helps outHas  Helper  and come in also4 children     Past Surgical History  Procedure Date  . Abdominal hysterectomy   . Tubal ligation   . Breast lumpectomy 2007    right  . Tonsillectomy   . Varicose vein surgery 1971  . Rotator cuff repair 1986    right  . Rectocele repair 1998  . Peg tube placement 04/2002    Family History  Problem Relation Age of Onset  . Cancer Mother     pancreatic  . Heart attack Father     No Known Allergies  Current Outpatient Prescriptions on File Prior to Visit  Medication Sig Dispense Refill  . alendronate (FOSAMAX) 70 MG tablet Take 70 mg by mouth every 7 (seven) days. Take with a full glass of water on an empty stomach.       . cloNIDine (CATAPRES) 0.1 MG tablet take 1 tablet by mouth twice a day  60 tablet  1  . FLUoxetine (PROZAC) 20 MG capsule take 1 capsule by mouth once daily  30 capsule  2  . gabapentin (NEURONTIN) 300 MG capsule Take 1 capsule (300 mg total) by mouth at bedtime as needed.  30 capsule  1  .  hydrochlorothiazide (MICROZIDE) 12.5 MG capsule take 1 capsule by mouth once daily  30 capsule  0  . HYDROcodone-acetaminophen (VICODIN) 5-500 MG per tablet Take 1 tablet by mouth every 6 (six) hours as needed.        . leflunomide (ARAVA) 20 MG tablet Take 20 mg by mouth daily.        . Multiple Vitamin (MULTIVITAMIN) tablet Take 1 tablet by mouth daily.        . potassium chloride (KLOR-CON) 20 MEQ packet Take 20 mEq by mouth daily.  7 tablet  0  . predniSONE (DELTASONE) 5 MG tablet Take 5 mg by mouth daily.        [name] warfarin (COUMADIN) 5 MG tablet Take 5 mg by mouth. 1/2 pill M, W, F and 5 mg all others      . DISCONTD: hydrochlorothiazide (HYDRODIURIL) 25 MG tablet Take 12.5 mg by mouth daily.       [name] DISCONTD: warfarin (COUMADIN) 5 MG tablet take 1 tablet by mouth once daily  30 tablet  5     patient denies chest pain, shortness of breath, orthopnea. Denies lower extremity edema, abdominal pain, change in  appetite, change in bowel movements. Patient denies rashes, musculoskeletal complaints. No other specific complaints in a complete review of systems.   BP 144/86  Pulse 88  Well-developed well-nourished female in no acute distress. HEENT exam :facial pasly affecting left side of face Chest CTA- CV-REG RATE ABd; soft nontender

## 2012-03-16 NOTE — Patient Instructions (Signed)
  Latest dosing instructions   Total Sun Mon Tue Wed Thu Fri Sat   27.5 5 mg 2.5 mg 5 mg 2.5 mg 5 mg 2.5 mg 5 mg    (5 mg1) (5 mg0.5) (5 mg1) (5 mg0.5) (5 mg1) (5 mg0.5) (5 mg1)        

## 2012-03-17 ENCOUNTER — Ambulatory Visit: Payer: PRIVATE HEALTH INSURANCE

## 2012-03-22 ENCOUNTER — Ambulatory Visit: Payer: PRIVATE HEALTH INSURANCE

## 2012-03-23 ENCOUNTER — Telehealth: Payer: Self-pay | Admitting: *Deleted

## 2012-03-23 NOTE — Telephone Encounter (Signed)
Debby, RN at [facility] is out at pts home and called to let Dr Cato Mulligan know that she is having SOB on exeration, has an irregular heart rate, and has a bp of 160/96.  If Dr Cato Mulligan wants her to do anything we need to call pt's son directly

## 2012-03-23 NOTE — Telephone Encounter (Signed)
appt scheduled, son is aware

## 2012-03-23 NOTE — Telephone Encounter (Signed)
If this is a significant change she should be evaluated within next 24 hours.  Emergency care at ED if necessary

## 2012-03-24 ENCOUNTER — Ambulatory Visit (INDEPENDENT_AMBULATORY_CARE_PROVIDER_SITE_OTHER): Payer: PRIVATE HEALTH INSURANCE | Admitting: Family

## 2012-03-24 ENCOUNTER — Encounter: Payer: Self-pay | Admitting: Family

## 2012-03-24 VITALS — BP 122/80 | Temp 98.1°F | Wt 112.0 lb

## 2012-03-24 DIAGNOSIS — E876 Hypokalemia: Secondary | ICD-10-CM

## 2012-03-24 DIAGNOSIS — R002 Palpitations: Secondary | ICD-10-CM

## 2012-03-24 DIAGNOSIS — Z79899 Other long term (current) drug therapy: Secondary | ICD-10-CM

## 2012-03-24 NOTE — Patient Instructions (Addendum)
Increase potassium to twice daily. Recheck potassium in 1 week.  CXR at [facility] on [place] today.   Hypokalemia Hypokalemia means a low potassium level in the blood.Potassium is an electrolyte that helps regulate the amount of fluid in the body. It also stimulates muscle contraction and maintains a stable acid-base balance.Most of the body's potassium is inside of cells, and only a very small amount is in the blood. Because the amount in the blood is so small, minor changes can have big effects. PREPARATION FOR TEST Testing for potassium requires taking a blood sample taken by needle from a vein in the arm. The skin is cleaned thoroughly before the sample is drawn. There is no other special preparation needed. NORMAL VALUES Potassium levels below 3.5 mEq/L are abnormally low. Levels above 5.1 mEq/L are abnormally high. Ranges for normal findings may vary among different laboratories and hospitals. You should always check with your doctor after having lab work or other tests done to discuss the meaning of your test results and whether your values are considered within normal limits. MEANING OF TEST  Your caregiver will go over the test results with you and discuss the importance and meaning of your results, as well as treatment options and the need for additional tests, if necessary. A potassium level is frequently part of a routine medical exam. It is usually included as part of a whole "panel" of tests for several blood salts (such as Sodium and Chloride). It may be done as part of follow-up when a low potassium level was found in the past or other blood salts are suspected of being out of balance. A low potassium level might be suspected if you have one or more of the following:  Symptoms of weakness.   Abnormal heart rhythms.   High blood pressure and are taking medication to control this, especially water pills (diuretics).   Kidney disease that can affect your potassium level .    Diabetes requiring the use of insulin. The potassium may fall after taking insulin, especially if the diabetes had been out of control for a while.   A condition requiring the use of cortisone-type medication or certain types of antibiotics.   Vomiting and/or diarrhea for more than a day or two.   A stomach or intestinal condition that may not permit appropriate absorption of potassium.   Fainting episodes.   Mental confusion.  OBTAINING TEST RESULTS It is your responsibility to obtain your test results. Ask the lab or department performing the test when and how you will get your results.  Please contact your caregiver directly if you have not received the results within one week. At that time, ask if there is anything different or new you should be doing in relation to the results. TREATMENT Hypokalemia can be treated with potassium supplements taken by mouth and/or adjustments in your current medications. A diet high in potassium is also helpful. Foods with high potassium content are:  Peas, lentils, lima beans, nuts, and dried fruit.   Whole grain and bran cereals and breads.   Fresh fruit, vegetables (bananas, cantaloupe, grapefruit, oranges, tomatoes, honeydew melons, potatoes).   Orange and tomato juices.   Meats. If potassium supplement has been prescribed for you today or your medications have been adjusted, see your personal caregiver in time02 for a re-check.  SEEK MEDICAL CARE IF:  There is a feeling of worsening weakness.   You experience repeated chest palpitations.   You are diabetic and having difficulty keeping your  blood sugars in the normal range.   You are experiencing vomiting and/or diarrhea.   You are having difficulty with any of your regular medications.  SEEK IMMEDIATE MEDICAL CARE IF:  You experience chest pain, shortness of breath, or episodes of dizziness.   You have been having vomiting or diarrhea for more than 2 days.   You have a  fainting episode.  MAKE SURE YOU:   Understand these instructions.   Will watch your condition.   Will get help right away if you are not doing well or get worse.  Document Released: 12/01/2005 Document Revised: 11/20/2011 Document Reviewed: 11/11/2008 [facility] Patient Information 2012 [place], [place].

## 2012-03-24 NOTE — Progress Notes (Signed)
Subjective:    Patient ID: Alyssa Waters, female    DOB: 1931-05-01, 78 y.o.   MRN: [id number]  HPI Comments: 78 yo black female presents with daughter with for follow up visit. Was seen by MD Sword on 03-16-2012 for evaluation bp and heart palpitations. Serum potassium on 4-2, 3.2 takes potassium chloride daily. Was started on fosamax last visit. BP improvied, 122/80. C/o shortness of breath for 2 days denies getting worse with exertion and c/o dry cough. Denies cp, diaphoresis, fever, chills, swelling in ankles or feet,  or other associated s/s. Lives with two sons who help care for patient. H/o PE takes coumadin 2.5mg  and 5mg  alternating days. Last INR on 03-16-2012 3.2  Hypertension Associated symptoms include shortness of breath.      Review of Systems  Constitutional: Negative.   Eyes: Negative.   Respiratory: Positive for cough and shortness of breath. Negative for apnea, choking, chest tightness, wheezing and stridor.   Cardiovascular: Negative.   Gastrointestinal: Negative.   Musculoskeletal: Negative.   Skin: Negative.   Neurological: Negative.   Psychiatric/Behavioral: Negative.    Past Medical History  Diagnosis Date  . HYPERTENSION 08/09/2007  . GERD (gastroesophageal reflux disease)   . Breast cancer   . Rheumatoid arthritis   . Stroke   . Endocarditis   . PE (pulmonary embolism)   . Hyperlipidemia     History   Social History  . Marital Status: Divorced    Spouse Name: N/A    Number of Children: N/A  . Years of Education: N/A   Occupational History  . Not on file.   Social History Main Topics  . Smoking status: Former Games developer  . Smokeless tobacco: Not on file  . Alcohol Use: Yes  . Drug Use:   . Sexually Active:    Other Topics Concern  . Not on file   Social History Narrative   Lives with somDaughter helps outHas  Helper  and come in also4 children     Past Surgical History  Procedure Date  . Abdominal hysterectomy   . Tubal ligation     . Breast lumpectomy 2007    right  . Tonsillectomy   . Varicose vein surgery 1971  . Rotator cuff repair 1986    right  . Rectocele repair 1998  . Peg tube placement 04/2002    Family History  Problem Relation Age of Onset  . Cancer Mother     pancreatic  . Heart attack Father     No Known Allergies  Current Outpatient Prescriptions on File Prior to Visit  Medication Sig Dispense Refill  . alendronate (FOSAMAX) 70 MG tablet Take 70 mg by mouth every 7 (seven) days. Take with a full glass of water on an empty stomach.       . cloNIDine (CATAPRES) 0.1 MG tablet take 1 tablet by mouth twice a day  60 tablet  1  . FLUoxetine (PROZAC) 20 MG capsule take 1 capsule by mouth once daily  30 capsule  2  . gabapentin (NEURONTIN) 300 MG capsule Take 1 capsule (300 mg total) by mouth at bedtime as needed.  30 capsule  1  . hydrochlorothiazide (MICROZIDE) 12.5 MG capsule take 1 capsule by mouth once daily  30 capsule  0  . HYDROcodone-acetaminophen (VICODIN) 5-500 MG per tablet Take 1 tablet by mouth every 6 (six) hours as needed.        . leflunomide (ARAVA) 20 MG tablet Take 20 mg by mouth daily.        [name]  Multiple Vitamin (MULTIVITAMIN) tablet Take 1 tablet by mouth daily.        . potassium chloride (KLOR-CON) 20 MEQ packet Take 20 mEq by mouth daily.  7 tablet  0  . predniSONE (DELTASONE) 5 MG tablet Take 5 mg by mouth daily.        [name] warfarin (COUMADIN) 5 MG tablet Take 5 mg by mouth. 1/2 pill M, W, F and 5 mg all others        BP 122/80  Temp(Src) 98.1 F (36.7 C) (Oral)  Wt 112 lb (50.803 kg)chart    Objective:   Physical Exam  Constitutional: She is oriented to person, place, and time. She appears well-developed and well-nourished. No distress.  Neck: No JVD present. No tracheal deviation present. No thyromegaly present.  Cardiovascular: Normal rate, regular rhythm, normal heart sounds and intact distal pulses.  Exam reveals no gallop and no friction rub.   No murmur  heard. Pulmonary/Chest: Effort normal. No stridor. No respiratory distress. She has no wheezes. She has no rales.  Musculoskeletal: She exhibits no edema and no tenderness.  Neurological: She is alert and oriented to person, place, and time.  Skin: Skin is warm and dry. She is not diaphoretic.          Assessment & Plan:  Assessment: Dyspnea, HTN, palpitation Plan: Labs: TSH, BMP, Pt/INR. Chest xray. EKG. Increase potassium to po daily. RTC in one week for recheck. Teaching handout provided on diagnosis and treatments. Opportunity for questions provided.

## 2012-03-25 LAB — BASIC METABOLIC PANEL
CO2: 29 mEq/L (ref 19–32)
Calcium: 9.5 mg/dL (ref 8.4–10.5)
Chloride: 103 mEq/L (ref 96–112)
Potassium: 3.6 mEq/L (ref 3.5–5.1)
Sodium: 143 mEq/L (ref 135–145)

## 2012-03-30 ENCOUNTER — Other Ambulatory Visit: Payer: Self-pay | Admitting: Internal Medicine

## 2012-03-30 ENCOUNTER — Ambulatory Visit (INDEPENDENT_AMBULATORY_CARE_PROVIDER_SITE_OTHER): Payer: PRIVATE HEALTH INSURANCE | Admitting: Internal Medicine

## 2012-03-30 DIAGNOSIS — I2699 Other pulmonary embolism without acute cor pulmonale: Secondary | ICD-10-CM

## 2012-03-30 NOTE — Patient Instructions (Signed)
  Latest dosing instructions   Total Sun Mon Tue Wed Thu Fri Sat   27.5 5 mg 2.5 mg 5 mg 2.5 mg 5 mg 2.5 mg 5 mg    (5 mg1) (5 mg0.5) (5 mg1) (5 mg0.5) (5 mg1) (5 mg0.5) (5 mg1)        

## 2012-03-31 ENCOUNTER — Other Ambulatory Visit: Payer: Self-pay | Admitting: Internal Medicine

## 2012-03-31 ENCOUNTER — Other Ambulatory Visit: Payer: PRIVATE HEALTH INSURANCE

## 2012-04-09 ENCOUNTER — Other Ambulatory Visit: Payer: Self-pay | Admitting: Internal Medicine

## 2012-04-13 ENCOUNTER — Ambulatory Visit: Payer: PRIVATE HEALTH INSURANCE

## 2012-04-13 DIAGNOSIS — I2699 Other pulmonary embolism without acute cor pulmonale: Secondary | ICD-10-CM

## 2012-04-13 DIAGNOSIS — Z7901 Long term (current) use of anticoagulants: Secondary | ICD-10-CM

## 2012-04-13 LAB — POCT INR: INR: 1.5

## 2012-04-13 NOTE — Patient Instructions (Signed)
  Latest dosing instructions   Total Sun Mon Tue Wed Thu Fri Sat   27.5 5 mg 2.5 mg 5 mg 2.5 mg 5 mg 2.5 mg 5 mg    (5 mg1) (5 mg0.5) (5 mg1) (5 mg0.5) (5 mg1) (5 mg0.5) (5 mg1)        

## 2012-04-27 ENCOUNTER — Ambulatory Visit: Payer: PRIVATE HEALTH INSURANCE

## 2012-04-28 ENCOUNTER — Ambulatory Visit (INDEPENDENT_AMBULATORY_CARE_PROVIDER_SITE_OTHER): Payer: PRIVATE HEALTH INSURANCE | Admitting: Family

## 2012-04-28 DIAGNOSIS — I2699 Other pulmonary embolism without acute cor pulmonale: Secondary | ICD-10-CM

## 2012-04-28 LAB — POCT INR: INR: 2.9

## 2012-04-28 NOTE — Patient Instructions (Signed)
  Latest dosing instructions   Total Sun Mon Tue Wed Thu Fri Sat   27.5 5 mg 2.5 mg 5 mg 2.5 mg 5 mg 2.5 mg 5 mg    (5 mg1) (5 mg0.5) (5 mg1) (5 mg0.5) (5 mg1) (5 mg0.5) (5 mg1)        

## 2012-05-03 ENCOUNTER — Other Ambulatory Visit: Payer: Self-pay | Admitting: Internal Medicine

## 2012-05-21 ENCOUNTER — Encounter: Payer: PRIVATE HEALTH INSURANCE | Admitting: Family

## 2012-05-24 ENCOUNTER — Other Ambulatory Visit: Payer: Self-pay | Admitting: Internal Medicine

## 2012-05-26 ENCOUNTER — Ambulatory Visit (INDEPENDENT_AMBULATORY_CARE_PROVIDER_SITE_OTHER): Payer: PRIVATE HEALTH INSURANCE | Admitting: Family

## 2012-05-26 DIAGNOSIS — I2699 Other pulmonary embolism without acute cor pulmonale: Secondary | ICD-10-CM

## 2012-05-26 LAB — POCT INR: INR: 4.2

## 2012-05-26 NOTE — Patient Instructions (Addendum)
  Latest dosing instructions   Total [name] Tue Wed Thu Fri Sat   25 2.5 mg 5 mg 2.5 mg 5 mg 2.5 mg 5 mg 2.5 mg    (5 mg0.5) (5 mg1) (5 mg0.5) (5 mg1) (5 mg0.5) (5 mg1) (5 mg0.5)       Hold dose today (Wednesday) and tomorrow (Thursday). Then 5 mg Mon, Wed, Fri and 2.5 mg every other day. Recheck in 1 week.

## 2012-06-03 ENCOUNTER — Other Ambulatory Visit: Payer: Self-pay | Admitting: Internal Medicine

## 2012-06-04 ENCOUNTER — Ambulatory Visit (INDEPENDENT_AMBULATORY_CARE_PROVIDER_SITE_OTHER): Payer: PRIVATE HEALTH INSURANCE | Admitting: Family

## 2012-06-04 DIAGNOSIS — I2699 Other pulmonary embolism without acute cor pulmonale: Secondary | ICD-10-CM

## 2012-06-04 LAB — POCT INR: INR: 3.2

## 2012-06-04 NOTE — Patient Instructions (Addendum)
1/2 tab today (2.5mg ) today only. Then 5 mg Mon, Wed, Fri and 2.5 mg every other day. Recheck in 3 week.    Latest dosing instructions   Total [name] Tue Wed Thu Fri Sat   25 2.5 mg 5 mg 2.5 mg 5 mg 2.5 mg 5 mg 2.5 mg    (5 mg0.5) (5 mg1) (5 mg0.5) (5 mg1) (5 mg0.5) (5 mg1) (5 mg0.5)

## 2012-06-20 ENCOUNTER — Other Ambulatory Visit: Payer: Self-pay | Admitting: Internal Medicine

## 2012-06-25 ENCOUNTER — Ambulatory Visit (INDEPENDENT_AMBULATORY_CARE_PROVIDER_SITE_OTHER): Payer: PRIVATE HEALTH INSURANCE | Admitting: Family

## 2012-06-25 DIAGNOSIS — I2699 Other pulmonary embolism without acute cor pulmonale: Secondary | ICD-10-CM

## 2012-06-25 LAB — POCT INR: INR: 2.9

## 2012-06-25 NOTE — Patient Instructions (Addendum)
  Latest dosing instructions   Total [name] Tue Wed Thu Fri Sat   25 2.5 mg 5 mg 2.5 mg 5 mg 2.5 mg 5 mg 2.5 mg    (5 mg0.5) (5 mg1) (5 mg0.5) (5 mg1) (5 mg0.5) (5 mg1) (5 mg0.5)       Continue 5 mg Mon, Wed, Fri and 2.5 mg every other day. Recheck in 4 week.

## 2012-06-28 ENCOUNTER — Other Ambulatory Visit: Payer: Self-pay | Admitting: Internal Medicine

## 2012-06-28 DIAGNOSIS — Z9889 Other specified postprocedural states: Secondary | ICD-10-CM

## 2012-06-28 DIAGNOSIS — Z853 Personal history of malignant neoplasm of breast: Secondary | ICD-10-CM

## 2012-06-29 ENCOUNTER — Other Ambulatory Visit: Payer: Self-pay | Admitting: Internal Medicine

## 2012-07-20 ENCOUNTER — Other Ambulatory Visit: Payer: Self-pay | Admitting: Internal Medicine

## 2012-07-23 ENCOUNTER — Ambulatory Visit (INDEPENDENT_AMBULATORY_CARE_PROVIDER_SITE_OTHER): Payer: PRIVATE HEALTH INSURANCE | Admitting: Family

## 2012-07-23 DIAGNOSIS — I2699 Other pulmonary embolism without acute cor pulmonale: Secondary | ICD-10-CM

## 2012-07-23 NOTE — Patient Instructions (Addendum)
Continue 5 mg Mon, Wed, Fri and 2.5 mg every other day. Recheck in 4 week.    Latest dosing instructions   Total Alyssa Waters Tue Wed Thu Fri Sat   25 2.5 mg 5 mg 2.5 mg 5 mg 2.5 mg 5 mg 2.5 mg    (5 mg0.5) (5 mg1) (5 mg0.5) (5 mg1) (5 mg0.5) (5 mg1) (5 mg0.5)

## 2012-07-26 ENCOUNTER — Other Ambulatory Visit: Payer: Self-pay | Admitting: Internal Medicine

## 2012-08-02 ENCOUNTER — Other Ambulatory Visit: Payer: Self-pay | Admitting: Internal Medicine

## 2012-08-06 ENCOUNTER — Ambulatory Visit
Admission: RE | Admit: 2012-08-06 | Discharge: 2012-08-06 | Disposition: A | Discharge status: Home / Self Care | Payer: PRIVATE HEALTH INSURANCE | Source: Ambulatory Visit | Attending: Internal Medicine | Admitting: Internal Medicine

## 2012-08-06 DIAGNOSIS — Z853 Personal history of malignant neoplasm of breast: Secondary | ICD-10-CM

## 2012-08-06 DIAGNOSIS — Z9889 Other specified postprocedural states: Secondary | ICD-10-CM

## 2012-08-20 ENCOUNTER — Ambulatory Visit (INDEPENDENT_AMBULATORY_CARE_PROVIDER_SITE_OTHER): Payer: PRIVATE HEALTH INSURANCE | Admitting: Family

## 2012-08-20 DIAGNOSIS — I2699 Other pulmonary embolism without acute cor pulmonale: Secondary | ICD-10-CM

## 2012-08-20 LAB — POCT INR: INR: 4.1

## 2012-08-20 NOTE — Patient Instructions (Signed)
Hold Coumadin Friday and Saturday.  Then resume Sunday at 1/2 tab (2.5mg ).  Take 2.5mg  everyday except 5mg  on Wednesday only.      Latest dosing instructions   Total Sun Mon Tue Wed Thu Fri Sat   20 2.5 mg 2.5 mg 2.5 mg 5 mg 2.5 mg 2.5 mg 2.5 mg    (5 mg0.5) (5 mg0.5) (5 mg0.5) (5 mg1) (5 mg0.5) (5 mg0.5) (5 mg0.5)

## 2012-09-07 ENCOUNTER — Telehealth: Payer: Self-pay | Admitting: Family

## 2012-09-07 ENCOUNTER — Ambulatory Visit: Payer: Self-pay | Admitting: Family

## 2012-09-07 DIAGNOSIS — I2699 Other pulmonary embolism without acute cor pulmonale: Secondary | ICD-10-CM

## 2012-09-07 NOTE — Telephone Encounter (Signed)
Needs INR checked asap. Was 4.1 last OV

## 2012-09-07 NOTE — Telephone Encounter (Signed)
Pt has all meds monitored at [facility] (daycare) [phone]. Verified with [facility] that they check pt/inr and dose medication accordingly.

## 2012-09-07 NOTE — Patient Instructions (Signed)
Patient being managed with [facility]

## 2012-09-29 ENCOUNTER — Other Ambulatory Visit: Payer: Self-pay | Admitting: *Deleted

## 2012-09-29 ENCOUNTER — Ambulatory Visit
Admission: RE | Admit: 2012-09-29 | Discharge: 2012-09-29 | Disposition: A | Discharge status: Home / Self Care | Payer: No Typology Code available for payment source | Source: Ambulatory Visit | Attending: *Deleted | Admitting: *Deleted

## 2012-09-29 DIAGNOSIS — M25552 Pain in left hip: Secondary | ICD-10-CM

## 2013-03-16 ENCOUNTER — Other Ambulatory Visit: Payer: Self-pay | Admitting: *Deleted

## 2013-03-16 ENCOUNTER — Ambulatory Visit
Admission: RE | Admit: 2013-03-16 | Discharge: 2013-03-16 | Disposition: A | Discharge status: Home / Self Care | Payer: PRIVATE HEALTH INSURANCE | Source: Ambulatory Visit | Attending: *Deleted | Admitting: *Deleted

## 2013-03-16 DIAGNOSIS — R109 Unspecified abdominal pain: Secondary | ICD-10-CM

## 2013-03-17 ENCOUNTER — Encounter (INDEPENDENT_AMBULATORY_CARE_PROVIDER_SITE_OTHER): Payer: Self-pay | Admitting: Surgery

## 2013-03-18 ENCOUNTER — Ambulatory Visit (INDEPENDENT_AMBULATORY_CARE_PROVIDER_SITE_OTHER): Payer: PRIVATE HEALTH INSURANCE | Admitting: Surgery

## 2013-03-18 ENCOUNTER — Encounter (INDEPENDENT_AMBULATORY_CARE_PROVIDER_SITE_OTHER): Payer: Self-pay | Admitting: Surgery

## 2013-03-18 VITALS — BP 132/78 | HR 78 | Temp 98.2°F | Resp 18

## 2013-03-18 DIAGNOSIS — K8010 Calculus of gallbladder with chronic cholecystitis without obstruction: Secondary | ICD-10-CM | POA: Insufficient documentation

## 2013-03-18 NOTE — Progress Notes (Signed)
Patient ID: Alyssa Waters, female   DOB: 17-Oct-1931, 79 y.o.   MRN: [id number]  Chief Complaint  Patient presents with  . New Evaluation    G.B    HPI Alyssa Waters is a 79 y.o. female.  Referred by Dr. Marny Lowenstein for evaluation of gallbladder disease HPI This is a 79 yo female with multiple medical problems that presents with a one-week history of poor appetite, limited PO intake, and RUQ abdominal pain.  The patient is partially incapacitated secondary to a stroke. She is wheelchair-bound. She states that she just doesn't feel like he. She denies any abdominal pain but does have tenderness on examination. She was seen by the nurse practitioner at her adult daycare facility who noted right upper quadrant pain. She underwent an ultrasound which showed multiple gallstones and some mild wall thickening. She did have a positive sonographic Murphy's sign. She denies any fever. She denies any nausea vomiting or diarrhea. She is accompanied today by her sister but her daughter is her primary caregiver.  Past Medical History  Diagnosis Date  . HYPERTENSION 08/09/2007  . GERD (gastroesophageal reflux disease)   . Rheumatoid arthritis   . Stroke   . Endocarditis   . PE (pulmonary embolism)   . Hyperlipidemia     Past Surgical History  Procedure Laterality Date  . Abdominal hysterectomy    . Tubal ligation    . Tonsillectomy    . Varicose vein surgery  1971  . Rotator cuff repair  1986    right  . Rectocele repair  1998  . Peg tube placement  04/2002  . Breast lumpectomy  2007    right    Family History  Problem Relation Age of Onset  . Cancer Mother     pancreatic  . Heart attack Father   . Heart disease Father   . Hypertension Sister   . Stroke Brother     Social History History  Substance Use Topics  . Smoking status: Former Games developer  . Smokeless tobacco: Not on file  . Alcohol Use: Yes    No Known Allergies  Current Outpatient Prescriptions  Medication Sig  Dispense Refill  . alendronate (FOSAMAX) 70 MG tablet Take 70 mg by mouth every 7 (seven) days. Take with a full glass of water on an empty stomach.       . cloNIDine (CATAPRES) 0.1 MG tablet take 1 tablet by mouth twice a day  60 tablet  1  . FLUoxetine (PROZAC) 20 MG capsule take 1 capsule by mouth once daily  30 capsule  2  . hydrochlorothiazide (MICROZIDE) 12.5 MG capsule take 1 capsule by mouth once daily  30 capsule  0  . hydrochlorothiazide (MICROZIDE) 12.5 MG capsule take 1 capsule by mouth once daily  30 capsule  0  . hydrochlorothiazide (MICROZIDE) 12.5 MG capsule take 1 capsule by mouth once daily  30 capsule  0  . HYDROcodone-acetaminophen (VICODIN) 5-500 MG per tablet Take 1 tablet by mouth every 6 (six) hours as needed.        [name] KLOR-CON M20 20 MEQ tablet TAKE 1 TABLET BY MOUTH ONCE DAILY  30 tablet  10  . leflunomide (ARAVA) 20 MG tablet Take 20 mg by mouth daily.        . Multiple Vitamin (MULTIVITAMIN) tablet Take 1 tablet by mouth daily.        . potassium chloride (KLOR-CON) 20 MEQ packet Take 20 mEq by mouth daily.  7 tablet  0  .  predniSONE (DELTASONE) 5 MG tablet Take 5 mg by mouth daily.        [name] gabapentin (NEURONTIN) 300 MG capsule Take 1 capsule (300 mg total) by mouth at bedtime as needed.  30 capsule  1   No current facility-administered medications for this visit.    Review of Systems Review of Systems  Constitutional: Positive for appetite change and fatigue. Negative for fever, chills and unexpected weight change.  HENT: Negative for hearing loss, congestion, sore throat, trouble swallowing and voice change.   Eyes: Negative for visual disturbance.  Respiratory: Negative for cough and wheezing.   Cardiovascular: Negative for chest pain, palpitations and leg swelling.  Gastrointestinal: Positive for abdominal pain. Negative for nausea, vomiting, diarrhea, constipation, blood in stool, abdominal distention and anal bleeding.  Genitourinary: Negative for  hematuria, vaginal bleeding and difficulty urinating.  Musculoskeletal: Negative for arthralgias.  Skin: Negative for rash and wound.  Neurological: Negative for seizures, syncope and headaches.  Hematological: Negative for adenopathy. Does not bruise/bleed easily.  Psychiatric/Behavioral: Negative for confusion.    Blood pressure 132/78, pulse 78, temperature 98.2 F (36.8 C), resp. rate 18.  Physical Exam Physical Exam  WDWN in NAD HEENT:  EOMI, sclera anicteric Neck:  No masses, no thyromegaly Lungs:  CTA bilaterally; normal respiratory effort CV:  Regular rate and rhythm; no murmurs Abd:  +bowel sounds, soft, tender in upper abdomen; no palpable masses Ext:  Well-perfused; no edema Skin:  Warm, dry; no sign of jaundice  Data Reviewed *RADIOLOGY REPORT*  Clinical Data: Abdominal pain  ABDOMINAL ULTRASOUND COMPLETE  Comparison: 09/04/2011  Findings:  Gallbladder: Multiple stones within the lumen of the gallbladder  are noted measuring up to 1.6 cm. Gallbladder wall is mildly  thickened measuring 3 mm. Sonographic Murphy's sign is mildly  positive.  Common Bile Duct: Within normal limits in caliber.  Liver: No focal mass lesion identified. Within normal limits in  parenchymal echogenicity.  IVC: Appears normal.  Pancreas: Diminished exam detail. No abnormality identified.  Spleen: Within normal limits in size and echotexture.  Right kidney: Normal in size with mild increased parenchymal  echogenicity. No evidence of mass or hydronephrosis.  Left kidney: Normal in size and parenchymal echogenicity measuring  9.3 cm. Too septated cysts are identified within the left kidney  measuring up to 2.5 cm.  Abdominal Aorta: Calcified atherosclerotic change involves the  abdominal aorta.  IMPRESSION:  1. Gallstones, gallbladder wall thickening and positive  sonographic Murphy's sign. Cannot rule out acute cholecystitis.  2. Mildly complex left renal cysts. These would be better   assessed with cross-sectional imaging. The study of choice there  is a contrast-enhanced MRI of the kidneys.  Original Report Authenticated By: Signa Kell, M.D.    Assessment    Chronic calculus cholecystitis.     Plan    Recommend laparoscopic cholecystectomy with intraoperative cholangiogram.  The surgical procedure has been discussed with the patient.  Potential risks, benefits, alternative treatments, and expected outcomes have been explained.  All of the patient's questions at this time have been answered.  The likelihood of reaching the patient's treatment goal is good.  The patient understands our recommendation, but wants to discuss this with her daughter.  They will call back to schedule surgery.  They are instructed to take her to the emergency department if she develops fever, worsening pain, nausea, vomiting.        Alyssa Waters K. 03/18/2013, 11:40 AM

## 2013-03-18 NOTE — Addendum Note (Signed)
Addended by: Wynona Luna on: 03/18/2013 11:54 AM   Modules accepted: Orders

## 2013-03-18 NOTE — Patient Instructions (Signed)
Call our surgery schedulers at [phone] to schedule surgery.  If your symptoms become worse (fever, worsening pain, nausea, vomiting), you need to go to the Emergency Department for evaluation.

## 2013-03-23 ENCOUNTER — Telehealth (INDEPENDENT_AMBULATORY_CARE_PROVIDER_SITE_OTHER): Payer: Self-pay | Admitting: *Deleted

## 2013-03-23 NOTE — Telephone Encounter (Signed)
Alyssa Abrahams NP called to let us know that family is concerned about proceeding with gallbladder removal due to they believe the surgery is only done on outpatient.  Family is requesting that patient at least be able to stay over night due to her age.  Explained that the patients needs will be taken into account when the surgery is scheduled.

## 2013-03-29 ENCOUNTER — Telehealth (INDEPENDENT_AMBULATORY_CARE_PROVIDER_SITE_OTHER): Payer: Self-pay | Admitting: General Surgery

## 2013-03-29 NOTE — Telephone Encounter (Signed)
LMOM for daughter [name]) to call back and ask for [facility]

## 2013-04-04 ENCOUNTER — Encounter (HOSPITAL_COMMUNITY): Payer: Self-pay

## 2013-04-07 ENCOUNTER — Encounter (HOSPITAL_COMMUNITY)
Admission: RE | Admit: 2013-04-07 | Discharge: 2013-04-07 | Disposition: A | Discharge status: Home / Self Care | Payer: Medicare (Managed Care) | Source: Ambulatory Visit | Attending: Surgery | Admitting: Surgery

## 2013-04-07 ENCOUNTER — Ambulatory Visit (HOSPITAL_COMMUNITY)
Admission: RE | Admit: 2013-04-07 | Discharge: 2013-04-07 | Disposition: A | Discharge status: Home / Self Care | Payer: Medicare (Managed Care) | Source: Ambulatory Visit | Attending: Anesthesiology | Admitting: Anesthesiology

## 2013-04-07 ENCOUNTER — Encounter (HOSPITAL_COMMUNITY): Payer: Self-pay

## 2013-04-07 DIAGNOSIS — Z7901 Long term (current) use of anticoagulants: Secondary | ICD-10-CM | POA: Insufficient documentation

## 2013-04-07 DIAGNOSIS — Z01810 Encounter for preprocedural cardiovascular examination: Secondary | ICD-10-CM | POA: Insufficient documentation

## 2013-04-07 DIAGNOSIS — K219 Gastro-esophageal reflux disease without esophagitis: Secondary | ICD-10-CM | POA: Insufficient documentation

## 2013-04-07 DIAGNOSIS — Z01812 Encounter for preprocedural laboratory examination: Secondary | ICD-10-CM | POA: Insufficient documentation

## 2013-04-07 DIAGNOSIS — Z01818 Encounter for other preprocedural examination: Secondary | ICD-10-CM | POA: Insufficient documentation

## 2013-04-07 DIAGNOSIS — I10 Essential (primary) hypertension: Secondary | ICD-10-CM | POA: Insufficient documentation

## 2013-04-07 HISTORY — DX: Depression, unspecified: F32.A

## 2013-04-07 HISTORY — DX: Shortness of breath: R06.02

## 2013-04-07 HISTORY — DX: Major depressive disorder, single episode, unspecified: F32.9

## 2013-04-07 HISTORY — DX: Dizziness and giddiness: R42

## 2013-04-07 LAB — BASIC METABOLIC PANEL
CO2: 26 mEq/L (ref 19–32)
Calcium: 9.7 mg/dL (ref 8.4–10.5)
GFR calc Af Amer: 67 mL/min — ABNORMAL LOW (ref >90)
GFR calc non Af Amer: 58 mL/min — ABNORMAL LOW (ref >90)
Sodium: 145 mEq/L (ref 135–145)

## 2013-04-07 LAB — CBC
MCH: 29.1 pg (ref 26.0–34.0)
Platelets: 244 10*3/uL (ref 150–400)
RBC: 4.99 MIL/uL (ref 3.87–5.11)
RDW: 14.6 % (ref 11.5–15.5)

## 2013-04-07 LAB — PROTIME-INR
INR: 1.55 — ABNORMAL HIGH (ref 0.00–1.49)
Prothrombin Time: 18.1 seconds — ABNORMAL HIGH (ref 11.6–15.2)

## 2013-04-07 LAB — SURGICAL PCR SCREEN: MRSA, PCR: NEGATIVE

## 2013-04-07 NOTE — Pre-Procedure Instructions (Signed)
Alyssa Waters  04/07/2013   Your procedure is scheduled on:  Tuesday, April 29th  Report to [facility] at 0745 AM.  Call this number if you have problems the morning of surgery: [phone]   Remember:   Do not eat food or drink liquids after midnight.    Take these medicines the morning of surgery with A SIP OF WATER: Prozac, Prednisone, Vicodin if needed   Do not wear jewelry, make-up or nail polish.  Do not wear lotions, powders, or perfumes,deodorant.  Do not shave 48 hours prior to surgery.   Do not bring valuables to the hospital.  Contacts, dentures or bridgework may not be worn into surgery.  Leave suitcase in the car. After surgery it may be brought to your room.  For patients admitted to the hospital, checkout time is 11:00 AM the day of discharge.   Patients discharged the day of surgery will not be allowed to drive home.   Special Instructions: Shower using CHG 2 nights before surgery and the night before surgery.  If you shower the day of surgery use CHG.  Use special wash - you have one bottle of CHG for all showers.  You should use approximately 1/3 of the bottle for each shower.   Please read over the following fact sheets that you were given: Pain Booklet, Coughing and Deep Breathing, MRSA Information and Surgical Site Infection Prevention

## 2013-04-07 NOTE — Progress Notes (Addendum)
Primary Physician - Dr. Boris Lown with [facility] Does not have a cardiologist  Patient had not stopped taking coumadin and has taken her dose for today. Instructed patient to stop taking coumadin as of today until instructed by surgeon after surgery. Patient's nurse and daughter verbalized understanding.

## 2013-04-11 MED ORDER — CEFAZOLIN SODIUM-DEXTROSE 2-3 GM-% IV SOLR: 2.0000 g | INTRAVENOUS | Status: DC

## 2013-04-12 ENCOUNTER — Encounter (HOSPITAL_COMMUNITY): Payer: Self-pay | Admitting: Anesthesiology

## 2013-04-12 ENCOUNTER — Encounter (HOSPITAL_COMMUNITY): Payer: Self-pay | Admitting: *Deleted

## 2013-04-12 ENCOUNTER — Emergency Department (HOSPITAL_COMMUNITY)
Admission: EM | Admit: 2013-04-12 | Discharge: 2013-04-12 | Disposition: A | Discharge status: Home / Self Care | Payer: Medicare (Managed Care) | Attending: Emergency Medicine | Admitting: Emergency Medicine

## 2013-04-12 ENCOUNTER — Encounter (HOSPITAL_COMMUNITY)
Admission: EM | Disposition: A | Discharge status: Home / Self Care | Payer: Self-pay | Source: Home / Self Care | Attending: Emergency Medicine

## 2013-04-12 ENCOUNTER — Ambulatory Visit (HOSPITAL_COMMUNITY): Admission: RE | Admit: 2013-04-12 | Payer: Medicare (Managed Care) | Source: Ambulatory Visit | Admitting: Surgery

## 2013-04-12 DIAGNOSIS — Z7901 Long term (current) use of anticoagulants: Secondary | ICD-10-CM | POA: Insufficient documentation

## 2013-04-12 DIAGNOSIS — Z79899 Other long term (current) drug therapy: Secondary | ICD-10-CM | POA: Insufficient documentation

## 2013-04-12 DIAGNOSIS — Z8709 Personal history of other diseases of the respiratory system: Secondary | ICD-10-CM | POA: Insufficient documentation

## 2013-04-12 DIAGNOSIS — Z862 Personal history of diseases of the blood and blood-forming organs and certain disorders involving the immune mechanism: Secondary | ICD-10-CM | POA: Insufficient documentation

## 2013-04-12 DIAGNOSIS — Z8719 Personal history of other diseases of the digestive system: Secondary | ICD-10-CM | POA: Insufficient documentation

## 2013-04-12 DIAGNOSIS — Z8639 Personal history of other endocrine, nutritional and metabolic disease: Secondary | ICD-10-CM | POA: Insufficient documentation

## 2013-04-12 DIAGNOSIS — Z8673 Personal history of transient ischemic attack (TIA), and cerebral infarction without residual deficits: Secondary | ICD-10-CM | POA: Insufficient documentation

## 2013-04-12 DIAGNOSIS — Y9389 Activity, other specified: Secondary | ICD-10-CM | POA: Insufficient documentation

## 2013-04-12 DIAGNOSIS — S0181XA Laceration without foreign body of other part of head, initial encounter: Secondary | ICD-10-CM

## 2013-04-12 DIAGNOSIS — Z8669 Personal history of other diseases of the nervous system and sense organs: Secondary | ICD-10-CM | POA: Insufficient documentation

## 2013-04-12 DIAGNOSIS — Z8659 Personal history of other mental and behavioral disorders: Secondary | ICD-10-CM | POA: Insufficient documentation

## 2013-04-12 DIAGNOSIS — W1809XA Striking against other object with subsequent fall, initial encounter: Secondary | ICD-10-CM | POA: Insufficient documentation

## 2013-04-12 DIAGNOSIS — IMO0002 Reserved for concepts with insufficient information to code with codable children: Secondary | ICD-10-CM | POA: Insufficient documentation

## 2013-04-12 DIAGNOSIS — Z8679 Personal history of other diseases of the circulatory system: Secondary | ICD-10-CM | POA: Insufficient documentation

## 2013-04-12 DIAGNOSIS — Y92009 Unspecified place in unspecified non-institutional (private) residence as the place of occurrence of the external cause: Secondary | ICD-10-CM | POA: Insufficient documentation

## 2013-04-12 DIAGNOSIS — M069 Rheumatoid arthritis, unspecified: Secondary | ICD-10-CM | POA: Insufficient documentation

## 2013-04-12 DIAGNOSIS — I10 Essential (primary) hypertension: Secondary | ICD-10-CM | POA: Insufficient documentation

## 2013-04-12 DIAGNOSIS — Z87891 Personal history of nicotine dependence: Secondary | ICD-10-CM | POA: Insufficient documentation

## 2013-04-12 DIAGNOSIS — Z86711 Personal history of pulmonary embolism: Secondary | ICD-10-CM | POA: Insufficient documentation

## 2013-04-12 DIAGNOSIS — S0180XA Unspecified open wound of other part of head, initial encounter: Secondary | ICD-10-CM | POA: Insufficient documentation

## 2013-04-12 SURGERY — LAPAROSCOPIC CHOLECYSTECTOMY WITH INTRAOPERATIVE CHOLANGIOGRAM
Anesthesia: General

## 2013-04-12 NOTE — ED Provider Notes (Signed)
History     CSN: [id number]  Arrival date & time 04/12/13  [id number]   First MD Initiated Contact with Patient 04/12/13 [phone]      Chief Complaint  Patient presents with  . Fall  . Head Laceration    (Consider location/radiation/quality/duration/timing/severity/associated sxs/prior treatment) Patient is a 79 y.o. female presenting with fall and scalp laceration. The history is provided by the patient and a relative.  Fall Pertinent negatives include no fever, no numbness, no abdominal pain, no vomiting and no headaches.  Head Laceration Pertinent negatives include no chest pain, no abdominal pain, no headaches and no shortness of breath.  pt s/p hitting head/fall this am. Was getting ready for day, was in sisters house (to come to short stay today for planned gallbladder surgery) - was up to bathroom without her walker, in unfamiliar surroundings per family, hit head, fell. No loc. Pt denies any faintness or dizziness. No headache. No neck or back pain. Lac to right forehead above eyebrown. Tet utd. Denies other pain or injury. Pt had been on coumadin - but held for past few weeks due to planned surgery today - no recent abnormal bruising or bleeding. Pt has not had her normal meds today.     Past Medical History  Diagnosis Date  . HYPERTENSION 08/09/2007  . GERD (gastroesophageal reflux disease)   . Rheumatoid arthritis   . Endocarditis   . PE (pulmonary embolism)   . Hyperlipidemia   . Shortness of breath     exertion  . Depression   . Stroke     left sided weakness  . Dizziness     Past Surgical History  Procedure Laterality Date  . Abdominal hysterectomy    . Tubal ligation    . Tonsillectomy    . Varicose vein surgery  1971  . Rotator cuff repair  1986    right  . Rectocele repair  1998  . Peg tube placement  04/2002    removed as well  . Breast lumpectomy  2007    right    Family History  Problem Relation Age of Onset  . Cancer Mother     pancreatic  . Heart  attack Father   . Heart disease Father   . Hypertension Sister   . Stroke Brother     History  Substance Use Topics  . Smoking status: Former Games developer  . Smokeless tobacco: Not on file  . Alcohol Use: No    OB History   Grav Para Term Preterm Abortions TAB SAB Ect Mult Living                  Review of Systems  Constitutional: Negative for fever and chills.  HENT: Negative for neck pain.   Eyes: Negative for redness and visual disturbance.  Respiratory: Negative for cough and shortness of breath.   Cardiovascular: Negative for chest pain and palpitations.  Gastrointestinal: Negative for vomiting, abdominal pain, diarrhea and blood in stool.  Genitourinary: Negative for flank pain.  Musculoskeletal: Negative for back pain.  Skin: Positive for wound.  Neurological: Negative for weakness, numbness and headaches.  Hematological: Does not bruise/bleed easily.  Psychiatric/Behavioral: Negative for confusion.    Allergies  Review of patient's allergies indicates no known allergies.  Home Medications   Current Outpatient Rx  Name  Route  Sig  Dispense  Refill  . alendronate (FOSAMAX) 70 MG tablet   Oral   Take 70 mg by mouth every Saturday. Take with a full  glass of water on an empty stomach.         . Cholecalciferol (VITAMIN D-3 PO)   Oral   Take 1 tablet by mouth daily.         [name] FLUoxetine (PROZAC) 20 MG capsule   Oral   Take 20 mg by mouth daily.         [name] gabapentin (NEURONTIN) 300 MG capsule   Oral   Take 300 mg by mouth daily as needed (hot flashes).         . hydrochlorothiazide (MICROZIDE) 12.5 MG capsule   Oral   Take 12.5 mg by mouth 2 (two) times daily.         [name] HYDROcodone-acetaminophen (NORCO/VICODIN) 5-325 MG per tablet   Oral   Take 1 tablet by mouth every 6 (six) hours as needed for pain.         [name] leflunomide (ARAVA) 20 MG tablet   Oral   Take 20 mg by mouth daily.           . Multiple Vitamin (MULTIVITAMIN WITH MINERALS)  TABS   Oral   Take 1 tablet by mouth daily.         . mupirocin ointment (BACTROBAN) 2 %   Topical   Apply 1 application topically 2 (two) times daily. To feet         . predniSONE (DELTASONE) 5 MG tablet   Oral   Take 5 mg by mouth daily.           . Skin Protectants, Misc. (PERIGUARD) OINT   Apply externally   Apply 1 application topically daily. To bottom         . warfarin (COUMADIN) 2.5 MG tablet   Oral   Take 2.5-5 mg by mouth daily. One 2.5mg  tablet on Monday, Wednesday, Friday, and Sunday. Two tablets (totalling 5mg ) on Tuesday, Thursday, and Saturday           BP 210/99  Pulse 90  Temp(Src) 97.9 F (36.6 C) (Oral)  Resp 19  SpO2 96%  Physical Exam  Nursing note and vitals reviewed. Constitutional: She is oriented to person, place, and time. She appears well-developed and well-nourished. No distress.  HENT:  3 cm lac right forehead  Eyes: Conjunctivae are normal. Pupils are equal, round, and reactive to light. No scleral icterus.  Neck: Neck supple. No tracheal deviation present.  No bruit  Cardiovascular: Normal rate, regular rhythm, normal heart sounds and intact distal pulses.   Pulmonary/Chest: Effort normal and breath sounds normal. No respiratory distress. She exhibits no tenderness.  Abdominal: Soft. Normal appearance. She exhibits no distension. There is no tenderness.  Musculoskeletal: Normal range of motion. She exhibits no edema and no tenderness.  CTLS spine, non tender, aligned, no step off. No focal bony tenderness or pain on bil extremity exam.    Neurological: She is alert and oriented to person, place, and time.  Motor intact bil.   Skin: Skin is warm and dry. No rash noted.  Psychiatric: She has a normal mood and affect.    ED Course  Procedures (including critical care time)     MDM  Suture cart.  Recheck spine nt.  Pt denies other pain or injury. No loc. No headache. No nv.  Pt states tetanus imm within the past 5  yrs.   Reviewed nursing notes and prior charts for additional history.   Recheck bp high 194/88.  Pt has not taken her normal meds/bp meds today  due to surgery.  No headache. No numbness/weakness. No cp or sob. No swelling.  Pt to go directly to short stay w family per her original plans.    LACERATION REPAIR Performed by: Suzi Roots Authorized by: Suzi Roots Consent: Verbal consent obtained. Risks and benefits: risks, benefits and alternatives were discussed Consent given by: patient Patient identity confirmed: provided demographic data Prepped and Draped in normal sterile fashion Wound explored  Laceration Location: forehead  Laceration Length: 3cm  No Foreign Bodies seen or palpated  Anesthesia: local infiltration  Local anesthetic: lidocaine 2% w epinephrine  Anesthetic total: 3 ml  Irrigation method: syringe Amount of cleaning: standard  Skin closure: 6-0 prolene  Number of sutures: 5  Technique: simple interrupted  Patient tolerance: Patient tolerated the procedure well with no immediate complications.  Sterile dressing. No new c/o. Spine nt. Pt stable for d/c.     Suzi Roots, MD 04/12/13 [phone]

## 2013-04-12 NOTE — ED Notes (Signed)
Pt reports she fell in bathroom today. Has laceration to right side of forehead, approximately 1 inch. Denies LOC. Scheduled to have surgery today, unsure what type of surgery.

## 2013-04-12 NOTE — ED Notes (Signed)
MD Steinl at bedside repairing laceration. Made aware of pt's BP. Pt has not taken BP medications due to having surgery today.

## 2013-04-12 NOTE — ED Notes (Signed)
Contacted short stay, pt scheduled for lap chole today. States to call after EDP sees pt and see if she is able to still have surgery.

## 2013-04-26 ENCOUNTER — Encounter (INDEPENDENT_AMBULATORY_CARE_PROVIDER_SITE_OTHER): Payer: PRIVATE HEALTH INSURANCE | Admitting: Surgery

## 2013-04-28 ENCOUNTER — Encounter: Payer: Medicare (Managed Care) | Admitting: Vascular Surgery

## 2013-06-02 ENCOUNTER — Other Ambulatory Visit: Payer: Self-pay

## 2013-06-02 ENCOUNTER — Encounter: Payer: Self-pay | Admitting: Surgery

## 2013-06-02 DIAGNOSIS — I739 Peripheral vascular disease, unspecified: Secondary | ICD-10-CM

## 2013-06-02 DIAGNOSIS — L97909 Non-pressure chronic ulcer of unspecified part of unspecified lower leg with unspecified severity: Secondary | ICD-10-CM

## 2013-06-03 ENCOUNTER — Encounter: Payer: Self-pay | Admitting: Surgery

## 2013-06-06 ENCOUNTER — Ambulatory Visit (INDEPENDENT_AMBULATORY_CARE_PROVIDER_SITE_OTHER): Payer: Medicare (Managed Care) | Admitting: Surgery

## 2013-06-06 ENCOUNTER — Encounter (INDEPENDENT_AMBULATORY_CARE_PROVIDER_SITE_OTHER): Payer: Medicare (Managed Care) | Admitting: *Deleted

## 2013-06-06 ENCOUNTER — Encounter: Payer: Self-pay | Admitting: Surgery

## 2013-06-06 ENCOUNTER — Telehealth: Payer: Self-pay | Admitting: *Deleted

## 2013-06-06 VITALS — BP 144/65 | HR 79 | Resp 14 | Ht 62.0 in | Wt 114.0 lb

## 2013-06-06 DIAGNOSIS — I739 Peripheral vascular disease, unspecified: Secondary | ICD-10-CM

## 2013-06-06 DIAGNOSIS — L97909 Non-pressure chronic ulcer of unspecified part of unspecified lower leg with unspecified severity: Secondary | ICD-10-CM

## 2013-06-06 DIAGNOSIS — L98499 Non-pressure chronic ulcer of skin of other sites with unspecified severity: Secondary | ICD-10-CM

## 2013-06-06 NOTE — Telephone Encounter (Signed)
I called Alyssa Waters at [facility] for authorization of this surgery by Dr. Myra Gianotti on 06-14-13.  Per Alyssa Waters, this surgery is OK'd and they will have the patient at the hospital at [facility] on 06-14-13.  Of Note, the patient is NOT on Coumadin at this time. Patient was taken off coumadin on 04-15-13 because she is a high risk for falls. I will inform Dr. Myra Gianotti of this.

## 2013-06-06 NOTE — Progress Notes (Signed)
Vascular and Vein Specialist of    Patient name: Alyssa Waters MRN: [id number] DOB: Apr 13, 1931 Sex: female   Referred by: Dr Pasty Spillers Dr. Dorothe Pea Reason for referral:  Chief Complaint  Patient presents with  . New Evaluation    C/O right 2nd toe ulcer duration ?  Ref. by Dr. Pasty Spillers    HISTORY OF PRESENT ILLNESS: The patient comes in today for evaluation of bilateral recurrent toe ulcers, right second toe greater than left. According to the patient however these have all been there for a couple days and she has never had them before. She denies having any significant pain.  The patient carries a diagnosis of hyperlipidemia. She is not taking a statin. She has a history of pulmonary embolism. She is supposed to be on lifelong anticoagulations. She has a history of stroke with residual left-sided weakness. She also has a history of endocarditis.  Past Medical History  Diagnosis Date  . HYPERTENSION 08/09/2007  . GERD (gastroesophageal reflux disease)   . Rheumatoid arthritis(714.0)   . Endocarditis   . PE (pulmonary embolism)   . Hyperlipidemia   . Shortness of breath     exertion  . Depression   . Stroke     left sided weakness  . Dizziness     Past Surgical History  Procedure Laterality Date  . Abdominal hysterectomy    . Tubal ligation    . Tonsillectomy    . Varicose vein surgery  1971  . Rotator cuff repair  1986    right  . Rectocele repair  1998  . Peg tube placement  04/2002    removed as well  . Breast lumpectomy  2007    right  . Wrist surgery Left   . Fracture surgery      History   Social History  . Marital Status: Divorced    Spouse Name: N/A    Number of Children: N/A  . Years of Education: N/A   Occupational History  . Not on file.   Social History Main Topics  . Smoking status: Former Smoker    Quit date: 12/15/2010  . Smokeless tobacco: Never Used  . Alcohol Use: No  . Drug Use: No  . Sexually Active: Not on file   Other  Topics Concern  . Not on file   Social History Narrative   Lives with som   Daughter helps out   Has  Helper  and come in also   4 children           Family History  Problem Relation Age of Onset  . Cancer Mother     pancreatic  . Heart attack Father   . Heart disease Father   . Hypertension Sister   . Stroke Brother     Allergies as of 06/06/2013 - Review Complete 06/06/2013  Allergen Reaction Noted  . Donepezil Nausea Only 06/02/2013    Current Outpatient Prescriptions on File Prior to Visit  Medication Sig Dispense Refill  . acetaminophen (TYLENOL) 325 MG tablet Take 325 mg by mouth.      [name] alendronate (FOSAMAX) 70 MG tablet Take 70 mg by mouth every Saturday. Take with a full glass of water on an empty stomach.      [name] amLODipine (NORVASC) 5 MG tablet Take 5 mg by mouth daily.      [name] aspirin 81 MG tablet Take 81 mg by mouth daily.      . Cholecalciferol (VITAMIN D-3 PO) Take 1 tablet by  mouth daily.      [name] FLUoxetine (PROZAC) 20 MG capsule Take 20 mg by mouth daily.      [name] gabapentin (NEURONTIN) 300 MG capsule Take 300 mg by mouth daily as needed (hot flashes).      . hydrochlorothiazide (MICROZIDE) 12.5 MG capsule Take 12.5 mg by mouth 2 (two) times daily.      [name] HYDROcodone-acetaminophen (NORCO/VICODIN) 5-325 MG per tablet Take 1 tablet by mouth every 6 (six) hours as needed for pain.      [name] leflunomide (ARAVA) 20 MG tablet Take 20 mg by mouth daily.      . Multiple Vitamin (MULTIVITAMIN WITH MINERALS) TABS Take 1 tablet by mouth daily.      . mupirocin ointment (BACTROBAN) 2 % Apply 1 application topically 2 (two) times daily. To feet      . predniSONE (DELTASONE) 5 MG tablet Take 5 mg by mouth daily.        . Skin Protectants, Misc. (PERIGUARD) OINT Apply 1 application topically daily. To bottom      . warfarin (COUMADIN) 2.5 MG tablet Take 2.5-5 mg by mouth daily. One 2.5mg  tablet on Monday, Wednesday, Friday, and Sunday. Two tablets (totalling 5mg ) on Tuesday,  Thursday, and Saturday       No current facility-administered medications on file prior to visit.     REVIEW OF SYSTEMS: Cardiovascular: No chest pain, chest pressure, palpitations, orthopnea, or dyspnea on exertion. No claudication or rest pain, Pulmonary: No productive cough, asthma or wheezing. Neurologic: No weakness, paresthesias, aphasia, or amaurosis. No dizziness. Hematologic: No bleeding problems or clotting disorders. Musculoskeletal: No joint pain or joint swelling. Gastrointestinal: No blood in stool or hematemesis Genitourinary: No dysuria or hematuria. Psychiatric:: No history of major depression. Integumentary:  right toe ulcer  Constitutional: No fever or chills.  PHYSICAL EXAMINATION: General: The patient appears their stated age.  Vital signs are BP 144/65  Pulse 79  Resp 14  Ht 5\' 2"  (1.575 m)  Wt 114 lb (51.71 kg)  BMI 20.85 kg/m2  SpO2 95% HEENT:  No gross abnormalities Pulmonary: Respirations are non-labored Abdomen: Soft and non-tender  Musculoskeletal: There are no major deformities.   Neurologic: Left-sided weakness  Skin: A 5 mm ulcer is identified on the dorsum of the right second toe without surrounding erythema. There is also early stage ulceration of the left second toe.  Psychiatric: The patient has normal affect. Cardiovascular: There is a regular rate and rhythm without significant murmur appreciated.Pedal pulses are not palpable.  Diagnostic Studies: Ultrasound was ordered and reviewed today. This shows an ABI that could not be calculated due to calcified vessels. The waveforms are biphasic bilaterally. Toe pressure on the right is 30. On the left it is 25.  Outside Studies/Documentation Historical records were reviewed.  They showed recurrent bilateral toe ulcers  Medication Changes: Patient scheduled for angiogram. Coumadin will be held 5 days prior to angiogram  Assessment:  Peripheral vascular disease with ulceration, right greater  than left  Plan: It was difficult to obtain information from the patient today, however she doesn't has bilateral lower extremity ulcers, with the right toe being worse than the left. This is clearly a limb threatening situation which I discussed with the patient. I feel she needs to undergo angiography to better define the disease within her arterial system and to potentially treat them with stent placement. I have scheduled this for Tuesday, July 1. I could not determine whether or not the patient was taking Coumadin.  She is this will he stopped 5 days prior. Is on a medication list however there hasn't been a 2 in the visit documented for a while. There are reports in the chart does state that she needs to be on lifelong anticoagulation.     Jorge Ny, M.D. [facility] [place] Office: [phone] Pager:  [phone]

## 2013-06-07 ENCOUNTER — Encounter (HOSPITAL_COMMUNITY): Payer: Self-pay | Admitting: Respiratory Therapy

## 2013-06-09 ENCOUNTER — Encounter: Payer: Self-pay | Admitting: *Deleted

## 2013-06-09 ENCOUNTER — Other Ambulatory Visit: Payer: Self-pay

## 2013-06-13 MED ORDER — CHLORHEXIDINE GLUCONATE 4 % EX LIQD
1.0000 "application " | Freq: Once | CUTANEOUS | Status: DC
  Filled 2013-06-13: qty 15

## 2013-06-13 MED ORDER — SODIUM CHLORIDE 0.9 % IV SOLN: INTRAVENOUS | Status: DC

## 2013-06-14 ENCOUNTER — Observation Stay (HOSPITAL_COMMUNITY)
Admission: RE | Admit: 2013-06-14 | Discharge: 2013-06-15 | Disposition: A | Discharge status: Home / Self Care | Payer: Medicare (Managed Care) | Source: Ambulatory Visit | Attending: Surgery | Admitting: Surgery

## 2013-06-14 ENCOUNTER — Encounter (HOSPITAL_COMMUNITY)
Admission: RE | Disposition: A | Discharge status: Home / Self Care | Payer: Self-pay | Source: Ambulatory Visit | Attending: Surgery

## 2013-06-14 ENCOUNTER — Encounter (HOSPITAL_COMMUNITY): Payer: Self-pay | Admitting: *Deleted

## 2013-06-14 DIAGNOSIS — I739 Peripheral vascular disease, unspecified: Principal | ICD-10-CM | POA: Insufficient documentation

## 2013-06-14 DIAGNOSIS — L98499 Non-pressure chronic ulcer of skin of other sites with unspecified severity: Secondary | ICD-10-CM

## 2013-06-14 DIAGNOSIS — Z79899 Other long term (current) drug therapy: Secondary | ICD-10-CM | POA: Insufficient documentation

## 2013-06-14 DIAGNOSIS — I10 Essential (primary) hypertension: Secondary | ICD-10-CM | POA: Insufficient documentation

## 2013-06-14 DIAGNOSIS — L97509 Non-pressure chronic ulcer of other part of unspecified foot with unspecified severity: Secondary | ICD-10-CM | POA: Insufficient documentation

## 2013-06-14 DIAGNOSIS — I69998 Other sequelae following unspecified cerebrovascular disease: Secondary | ICD-10-CM | POA: Insufficient documentation

## 2013-06-14 DIAGNOSIS — R29898 Other symptoms and signs involving the musculoskeletal system: Secondary | ICD-10-CM | POA: Insufficient documentation

## 2013-06-14 DIAGNOSIS — I743 Embolism and thrombosis of arteries of the lower extremities: Secondary | ICD-10-CM

## 2013-06-14 DIAGNOSIS — Z7901 Long term (current) use of anticoagulants: Secondary | ICD-10-CM | POA: Insufficient documentation

## 2013-06-14 DIAGNOSIS — Z86711 Personal history of pulmonary embolism: Secondary | ICD-10-CM | POA: Insufficient documentation

## 2013-06-14 DIAGNOSIS — E785 Hyperlipidemia, unspecified: Secondary | ICD-10-CM | POA: Insufficient documentation

## 2013-06-14 HISTORY — PX: ABDOMINAL AORTAGRAM: SHX5454

## 2013-06-14 LAB — POCT I-STAT, CHEM 8
BUN: 18 mg/dL (ref 6–23)
Creatinine, Ser: 1 mg/dL (ref 0.50–1.10)
Glucose, Bld: 65 mg/dL — ABNORMAL LOW (ref 70–99)
Hemoglobin: 14.3 g/dL (ref 12.0–15.0)
Sodium: 143 mEq/L (ref 135–145)
TCO2: 27 mmol/L (ref 0–100)

## 2013-06-14 LAB — CBC
MCH: 28.8 pg (ref 26.0–34.0)
Platelets: 239 10*3/uL (ref 150–400)
RBC: 4.79 MIL/uL (ref 3.87–5.11)
WBC: 8.2 10*3/uL (ref 4.0–10.5)

## 2013-06-14 LAB — MRSA PCR SCREENING: MRSA by PCR: NEGATIVE

## 2013-06-14 LAB — POCT ACTIVATED CLOTTING TIME
Activated Clotting Time: 217 seconds
Activated Clotting Time: 211 seconds
Activated Clotting Time: 145 seconds

## 2013-06-14 SURGERY — ABDOMINAL AORTAGRAM
Anesthesia: LOCAL | Laterality: Right

## 2013-06-14 MED ORDER — ASPIRIN 81 MG PO TABS
81.0000 mg | ORAL_TABLET | Freq: Every day | ORAL | Status: DC
Start: 2013-06-14 — End: 2013-06-14

## 2013-06-14 MED ORDER — PHENOL 1.4 % MT LIQD
1.0000 | OROMUCOSAL | Status: DC | PRN
  Filled 2013-06-14: qty 177

## 2013-06-14 MED ORDER — ENSURE COMPLETE PO LIQD: 237.0000 mL | Freq: Two times a day (BID) | ORAL | Status: DC

## 2013-06-14 MED ORDER — EPTIFIBATIDE 75 MG/100ML IV SOLN
INTRAVENOUS | Status: AC
  Filled 2013-06-14: qty 100

## 2013-06-14 MED ORDER — METOPROLOL TARTRATE 1 MG/ML IV SOLN
2.0000 mg | INTRAVENOUS | Status: DC | PRN
  Administered 2013-06-14: 2.5 mg via INTRAVENOUS
  Filled 2013-06-14: qty 5

## 2013-06-14 MED ORDER — ONDANSETRON HCL 4 MG/2ML IJ SOLN
4.0000 mg | Freq: Four times a day (QID) | INTRAMUSCULAR | Status: DC | PRN
  Administered 2013-06-14: 4 mg via INTRAVENOUS
  Filled 2013-06-14: qty 2

## 2013-06-14 MED ORDER — ALUM & MAG HYDROXIDE-SIMETH 200-200-20 MG/5ML PO SUSP: 15.0000 mL | ORAL | Status: DC | PRN

## 2013-06-14 MED ORDER — ADULT MULTIVITAMIN W/MINERALS CH
1.0000 | ORAL_TABLET | Freq: Every day | ORAL | Status: DC
  Filled 2013-06-14 (×2): qty 1

## 2013-06-14 MED ORDER — LEFLUNOMIDE 20 MG PO TABS
20.0000 mg | ORAL_TABLET | Freq: Every day | ORAL | Status: DC
  Filled 2013-06-14 (×2): qty 1

## 2013-06-14 MED ORDER — AMLODIPINE BESYLATE 10 MG PO TABS
10.0000 mg | ORAL_TABLET | Freq: Every day | ORAL | Status: DC
  Filled 2013-06-14 (×2): qty 1

## 2013-06-14 MED ORDER — SODIUM CHLORIDE 0.9 % IV SOLN
INTRAVENOUS | Status: DC
  Administered 2013-06-14: 23:00:00 via INTRAVENOUS

## 2013-06-14 MED ORDER — ACETAMINOPHEN 325 MG PO TABS
325.0000 mg | ORAL_TABLET | ORAL | Status: DC | PRN
  Administered 2013-06-14: 650 mg via ORAL
  Filled 2013-06-14: qty 2

## 2013-06-14 MED ORDER — TRAMADOL HCL 50 MG PO TABS
50.0000 mg | ORAL_TABLET | Freq: Four times a day (QID) | ORAL | Status: DC | PRN
  Filled 2013-06-14: qty 1

## 2013-06-14 MED ORDER — ASPIRIN 81 MG PO CHEW: 81.0000 mg | CHEWABLE_TABLET | Freq: Every day | ORAL | Status: DC

## 2013-06-14 MED ORDER — EPTIFIBATIDE 75 MG/100ML IV SOLN
1.0000 ug/kg/min | INTRAVENOUS | Status: DC
  Administered 2013-06-14: 1 ug/kg/min via INTRAVENOUS
  Filled 2013-06-14 (×2): qty 100

## 2013-06-14 MED ORDER — FLUOXETINE HCL 20 MG PO CAPS
20.0000 mg | ORAL_CAPSULE | Freq: Every day | ORAL | Status: DC
  Filled 2013-06-14 (×2): qty 1

## 2013-06-14 MED ORDER — HEPARIN (PORCINE) IN NACL 2-0.9 UNIT/ML-% IJ SOLN
INTRAMUSCULAR | Status: AC
  Filled 2013-06-14: qty 1000

## 2013-06-14 MED ORDER — LABETALOL HCL 5 MG/ML IV SOLN: 10.0000 mg | INTRAVENOUS | Status: DC | PRN

## 2013-06-14 MED ORDER — VERAPAMIL HCL 2.5 MG/ML IV SOLN
INTRAVENOUS | Status: AC
  Filled 2013-06-14: qty 2

## 2013-06-14 MED ORDER — HEPARIN SODIUM (PORCINE) 1000 UNIT/ML IJ SOLN
INTRAMUSCULAR | Status: AC
  Filled 2013-06-14: qty 1

## 2013-06-14 MED ORDER — ENSURE PO LIQD: 237.0000 mL | Freq: Two times a day (BID) | ORAL | Status: DC

## 2013-06-14 MED ORDER — FENTANYL CITRATE 0.05 MG/ML IJ SOLN
INTRAMUSCULAR | Status: AC
  Filled 2013-06-14: qty 2

## 2013-06-14 MED ORDER — LIDOCAINE HCL (PF) 1 % IJ SOLN
INTRAMUSCULAR | Status: AC
  Filled 2013-06-14: qty 30

## 2013-06-14 MED ORDER — HYDRALAZINE HCL 20 MG/ML IJ SOLN
10.0000 mg | INTRAMUSCULAR | Status: DC | PRN
  Administered 2013-06-14: 10 mg via INTRAVENOUS
  Filled 2013-06-14: qty 1

## 2013-06-14 MED ORDER — ALENDRONATE SODIUM 70 MG PO TABS: 70.0000 mg | ORAL_TABLET | ORAL | Status: DC

## 2013-06-14 MED ORDER — NITROGLYCERIN 0.2 MG/ML ON CALL CATH LAB
INTRAVENOUS | Status: AC
  Filled 2013-06-14: qty 10

## 2013-06-14 MED ORDER — GABAPENTIN 300 MG PO CAPS
300.0000 mg | ORAL_CAPSULE | Freq: Every day | ORAL | Status: DC | PRN
  Filled 2013-06-14: qty 1

## 2013-06-14 MED ORDER — GUAIFENESIN-DM 100-10 MG/5ML PO SYRP: 15.0000 mL | ORAL_SOLUTION | ORAL | Status: DC | PRN

## 2013-06-14 MED ORDER — ACETAMINOPHEN 650 MG RE SUPP: 325.0000 mg | RECTAL | Status: DC | PRN

## 2013-06-14 NOTE — Progress Notes (Signed)
Order for sheath removal verified per post procedural orders. Procedure explained to patient and Lt femoral artery access site assessed: level 0, palpable dorsalis pedis and posterior tibial pulses. 6French Sheath removed and manual pressure applied for 20 minutes. Pre, peri, & post procedural vitals: HR 98, RR 18, O2 Sat 98, BP 198/100, Pain 0. Pateint experience vagal response with nausea, bp drop to 105/67. Nurse called to room and administered Zofran 4mg .Distal pulses remained intact after sheath removal. Access site level 0 and dressed with 4X4 gauze and tegaderm.  Dennie Bible, RN confirmed condition of site. Post procedural instructions discussed with return demonstration from patient.

## 2013-06-14 NOTE — Progress Notes (Signed)
Orthopedic Tech Progress Note Patient Details:  Alyssa Waters 31-Jan-1931 [id number]  Ortho Devices Type of Ortho Device: Knee Immobilizer Ortho Device/Splint Location: left leg Ortho Device/Splint Interventions: Application Viewed order from rn order list  Nikki Dom 06/14/2013, 1:39 PM

## 2013-06-14 NOTE — H&P (View-Only) (Signed)
Vascular and Vein Specialist of [place]   Patient name: Alyssa Waters MRN: [id number] DOB: 1931/09/11 Sex: female   Referred by: Dr Pasty Spillers Dr. Dorothe Pea Reason for referral:  Chief Complaint  Patient presents with  . New Evaluation    C/O right 2nd toe ulcer duration ?  Ref. by Dr. Pasty Spillers    HISTORY OF PRESENT ILLNESS: The patient comes in today for evaluation of bilateral recurrent toe ulcers, right second toe greater than left. According to the patient however these have all been there for a couple days and she has never had them before. She denies having any significant pain.  The patient carries a diagnosis of hyperlipidemia. She is not taking a statin. She has a history of pulmonary embolism. She is supposed to be on lifelong anticoagulations. She has a history of stroke with residual left-sided weakness. She also has a history of endocarditis.  Past Medical History  Diagnosis Date  . HYPERTENSION 08/09/2007  . GERD (gastroesophageal reflux disease)   . Rheumatoid arthritis(714.0)   . Endocarditis   . PE (pulmonary embolism)   . Hyperlipidemia   . Shortness of breath     exertion  . Depression   . Stroke     left sided weakness  . Dizziness     Past Surgical History  Procedure Laterality Date  . Abdominal hysterectomy    . Tubal ligation    . Tonsillectomy    . Varicose vein surgery  1971  . Rotator cuff repair  1986    right  . Rectocele repair  1998  . Peg tube placement  04/2002    removed as well  . Breast lumpectomy  2007    right  . Wrist surgery Left   . Fracture surgery      History   Social History  . Marital Status: Divorced    Spouse Name: N/A    Number of Children: N/A  . Years of Education: N/A   Occupational History  . Not on file.   Social History Main Topics  . Smoking status: Former Smoker    Quit date: 12/15/2010  . Smokeless tobacco: Never Used  . Alcohol Use: No  . Drug Use: No  . Sexually Active: Not on file   Other  Topics Concern  . Not on file   Social History Narrative   Lives with som   Daughter helps out   Has  Helper  and come in also   4 children           Family History  Problem Relation Age of Onset  . Cancer Mother     pancreatic  . Heart attack Father   . Heart disease Father   . Hypertension Sister   . Stroke Brother     Allergies as of 06/06/2013 - Review Complete 06/06/2013  Allergen Reaction Noted  . Donepezil Nausea Only 06/02/2013    Current Outpatient Prescriptions on File Prior to Visit  Medication Sig Dispense Refill  . acetaminophen (TYLENOL) 325 MG tablet Take 325 mg by mouth.      [name] alendronate (FOSAMAX) 70 MG tablet Take 70 mg by mouth every Saturday. Take with a full glass of water on an empty stomach.      [name] amLODipine (NORVASC) 5 MG tablet Take 5 mg by mouth daily.      [name] aspirin 81 MG tablet Take 81 mg by mouth daily.      . Cholecalciferol (VITAMIN D-3 PO) Take 1 tablet by  mouth daily.      [name] FLUoxetine (PROZAC) 20 MG capsule Take 20 mg by mouth daily.      [name] gabapentin (NEURONTIN) 300 MG capsule Take 300 mg by mouth daily as needed (hot flashes).      . hydrochlorothiazide (MICROZIDE) 12.5 MG capsule Take 12.5 mg by mouth 2 (two) times daily.      [name] HYDROcodone-acetaminophen (NORCO/VICODIN) 5-325 MG per tablet Take 1 tablet by mouth every 6 (six) hours as needed for pain.      [name] leflunomide (ARAVA) 20 MG tablet Take 20 mg by mouth daily.      . Multiple Vitamin (MULTIVITAMIN WITH MINERALS) TABS Take 1 tablet by mouth daily.      . mupirocin ointment (BACTROBAN) 2 % Apply 1 application topically 2 (two) times daily. To feet      . predniSONE (DELTASONE) 5 MG tablet Take 5 mg by mouth daily.        . Skin Protectants, Misc. (PERIGUARD) OINT Apply 1 application topically daily. To bottom      . warfarin (COUMADIN) 2.5 MG tablet Take 2.5-5 mg by mouth daily. One 2.5mg  tablet on Monday, Wednesday, Friday, and Sunday. Two tablets (totalling 5mg ) on Tuesday,  Thursday, and Saturday       No current facility-administered medications on file prior to visit.     REVIEW OF SYSTEMS: Cardiovascular: No chest pain, chest pressure, palpitations, orthopnea, or dyspnea on exertion. No claudication or rest pain, Pulmonary: No productive cough, asthma or wheezing. Neurologic: No weakness, paresthesias, aphasia, or amaurosis. No dizziness. Hematologic: No bleeding problems or clotting disorders. Musculoskeletal: No joint pain or joint swelling. Gastrointestinal: No blood in stool or hematemesis Genitourinary: No dysuria or hematuria. Psychiatric:: No history of major depression. Integumentary:  right toe ulcer  Constitutional: No fever or chills.  PHYSICAL EXAMINATION: General: The patient appears their stated age.  Vital signs are BP 144/65  Pulse 79  Resp 14  Ht 5\' 2"  (1.575 m)  Wt 114 lb (51.71 kg)  BMI 20.85 kg/m2  SpO2 95% HEENT:  No gross abnormalities Pulmonary: Respirations are non-labored Abdomen: Soft and non-tender  Musculoskeletal: There are no major deformities.   Neurologic: Left-sided weakness  Skin: A 5 mm ulcer is identified on the dorsum of the right second toe without surrounding erythema. There is also early stage ulceration of the left second toe.  Psychiatric: The patient has normal affect. Cardiovascular: There is a regular rate and rhythm without significant murmur appreciated.Pedal pulses are not palpable.  Diagnostic Studies: Ultrasound was ordered and reviewed today. This shows an ABI that could not be calculated due to calcified vessels. The waveforms are biphasic bilaterally. Toe pressure on the right is 30. On the left it is 25.  Outside Studies/Documentation Historical records were reviewed.  They showed recurrent bilateral toe ulcers  Medication Changes: Patient scheduled for angiogram. Coumadin will be held 5 days prior to angiogram  Assessment:  Peripheral vascular disease with ulceration, right greater  than left  Plan: It was difficult to obtain information from the patient today, however she doesn't has bilateral lower extremity ulcers, with the right toe being worse than the left. This is clearly a limb threatening situation which I discussed with the patient. I feel she needs to undergo angiography to better define the disease within her arterial system and to potentially treat them with stent placement. I have scheduled this for Tuesday, July 1. I could not determine whether or not the patient was taking Coumadin.  She is this will he stopped 5 days prior. Is on a medication list however there hasn't been a 2 in the visit documented for a while. There are reports in the chart does state that she needs to be on lifelong anticoagulation.     Jorge Ny, M.D. [facility] [place] Office: [phone] Pager:  [phone]

## 2013-06-14 NOTE — Op Note (Signed)
[facility] Specialists of [place]  Patient name: Alyssa Waters MRN: [id number] DOB: 07/31/1931 Sex: female  06/14/2013 Pre-operative Diagnosis: Bilateral lower extremity ulcers Post-operative diagnosis:  Same Surgeon:  Jorge Ny Procedure Performed:  1.  ultrasound-guided access, left femoral artery   2.  abdominal aortogram  3.  bilateral lower extremity runoff  4.  third order catheterization  5.  atherectomy, right popliteal artery  6.  angioplasty right popliteal artery  7.  intra-arterial administration of nitroglycerin and Integrilin   Indications:  The patient presented to the office with bilateral ulcerations. Ultrasound suggested severe arterial disease. She comes in today for further evaluation and possible intervention.  Procedure:  The patient was identified in the holding area and taken to room 8.  The patient was then placed supine on the table and prepped and draped in the usual sterile fashion.  A time out was called.  Ultrasound was used to evaluate the left common femoral artery.  It was patent .  A digital ultrasound image was acquired.  A micropuncture needle was used to access the left common femoral artery under ultrasound guidance.  An 018 wire was advanced without resistance and a micropuncture sheath was placed.  The 018 wire was removed and a benson wire was placed.  The micropuncture sheath was exchanged for a 5 french sheath.  An omniflush catheter was advanced over the wire to the level of L-1.  An abdominal angiogram was obtained.  Next, using the omniflush catheter and a benson wire, the aortic bifurcation was crossed and the catheter was placed into theright external iliac artery and right runoff was obtained.  left runoff was performed via retrograde sheath injections.  Findings:   Aortogram:  The visualized portions of the suprarenal abdominal aorta showed no significant disease. There are single renal arteries which are widely patent. The  infrarenal abdominal aorta is widely patent. Bilateral common and external iliac arteries are widely patent.  Right Lower Extremity:  2 areas of approximately 30-40% stenosis are visualized within the proximal superficial femoral artery. Within the popliteal artery above the knee, there is diffuse long segment stenosis of approximately 70-80%. The below knee popliteal artery is widely patent. The dominant runoff vessel is the anterior tibial. The peroneal artery collateralizes at the ankle. The posterior tibial artery is occluded.  Left Lower Extremity:  The left common femoral artery is widely patent. The superficial femoral artery is patent. Profunda femoral artery is widely patent. The above-knee popliteal artery had similar disease pattern as the right leg. There is diffuse stenosis throughout. The below knee popliteal artery is patent. There is single vessel runoff via the peroneal artery  Intervention:  After the above images were acquired, the decision was made to proceed with intervention. Over an 035 Benson wire, a 7 [place] Ansel 1 sheath was advanced into the right superficial femoral artery. The patient was fully heparinized. Using the Bentson wire and a Berenstein 2 catheter, the wire and catheter were advanced down into the below knee popliteal artery. The 018 Viper wire was then placed. I selected a 1.5 classic CSI atherectomy device and performed atherectomy at low medium and high speed throughout the above-knee popliteal artery. I then performed angioplasty of the atherectomized segment using a 5 x 1 20 Fox SV balloon. Completion arteriogram revealed resolution of the stenosis within the popliteal artery above the knee. Additional runoff images were acquired. Excellent runoff was visualized down to the ankle, however at the ankle the collateral from  the peroneal to the posterior tibial artery was not visualized. There also appeared to be less collateral vessels filling with contrast, suggesting  small particle embolization from the atherectomy. I then reinserted the Berenstein 2 catheter into the below knee popliteal artery and on 2 separate occasions administered 400 mcg of nitroglycerin. I also injected a bolus of Integrilin at 180 mcg per kilo. I elected not to infuse TPA given the patient's Alzheimer's and age as I felt that the bleeding risk was too high. There is nothing surgical to address. Therefore, the patient will be left on Integrilin overnight. She does have contrast filling out to her digital arteries. The sheath was then withdrawn and the patient was taken to the holding area.  Impression:  #1  atherectomy and angioplasty of a diffusely diseased right above-knee popliteal artery using a CSI 1.5 classic device and a 5 mm Fox SV balloon  #2  small particle embolization which affected collaterals out onto the right foot. The patient's foot is perfused however the collaterals are less prominent after the intervention. The patient will be admitted to the hospital for Integrilin infusion overnight.  #3  diffusely diseased above-knee left popliteal artery with single-vessel runoff via the peroneal artery    V. Durene Cal, M.D. [facility] [place] Office: [phone] Pager:  [phone]

## 2013-06-14 NOTE — Interval H&P Note (Signed)
History and Physical Interval Note:  06/14/2013 8:31 AM  Alyssa Waters  has presented today for surgery, with the diagnosis of pvd with ulcer  The various methods of treatment have been discussed with the patient and family. After consideration of risks, benefits and other options for treatment, the patient has consented to  Procedure(s): ABDOMINAL AORTAGRAM (N/A) as a surgical intervention .  The patient's history has been reviewed, patient examined, no change in status, stable for surgery.  I have reviewed the patient's chart and labs.  Questions were answered to the patient's satisfaction.     BRABHAM IV, V. WELLS

## 2013-06-14 NOTE — Progress Notes (Signed)
PHARMACIST - PHYSICIAN COMMUNICATION  CONCERNING: P&T Medication Policy Regarding Oral Bisphosphonates  RECOMMENDATION: Your order for alendronate (Fosamax), ibandronate (Boniva), or risedronate (Actonel) has been discontinued at this time.  If the patient's post-hospital medical condition warrants safe use of this class of drugs, please resume the pre-hospital regimen upon discharge.  DESCRIPTION:  Alendronate (Fosamax), ibandronate (Boniva), and risedronate (Actonel) can cause severe esophageal erosions in patients who are unable to remain upright at least 30 minutes after taking this medication.   Since brief interruptions in therapy are thought to have minimal impact on bone mineral density, the Pharmacy & Therapeutics Committee has established that bisphosphonate orders should be routinely discontinued during hospitalization.   To override this safety policy and permit administration of Boniva, Fosamax, or Actonel in the hospital, prescribers must write "DO NOT HOLD" in the comments section when placing the order for this class of medications.  Link Snuffer, PharmD, BCPS Clinical Pharmacist [phone]

## 2013-06-14 NOTE — Progress Notes (Signed)
ANTICOAGULATION CONSULT NOTE - Initial Consult  Pharmacy Consult for integrilin Indication: small particle embolization  Allergies  Allergen Reactions  . Donepezil Nausea Only    Patient Measurements: Height: 5\' 2"  (157.5 cm) Weight: 114 lb (51.71 kg) IBW/kg (Calculated) : 50.1  Vital Signs: Temp: 98.3 F (36.8 C) (07/01 0646) Temp src: Oral (07/01 0646) BP: 190/84 mmHg (07/01 1330) Pulse Rate: 74 (07/01 1330)  Labs:  Recent Labs  06/14/13 0730  HGB 14.3  HCT 42.0  CREATININE 1.00    Estimated Creatinine Clearance: 35.5 ml/min (by C-G formula based on Cr of 1).   Medical History: Past Medical History  Diagnosis Date  . HYPERTENSION 08/09/2007  . GERD (gastroesophageal reflux disease)   . Rheumatoid arthritis(714.0)   . Endocarditis   . PE (pulmonary embolism)   . Hyperlipidemia   . Shortness of breath     exertion  . Depression   . Stroke     left sided weakness  . Dizziness    Assessment: 79 year old female s/p vascular surgery d/t bilateral ulcerations and severe arterial diseases. Post procedure it was noted that collaterals appeared to have less filling with contrast suggesting small particle embolization from atherectomy. Intra-arterial ntg and integrilin was started in vascular lab. Pharmacy now consulted to manage. Crcl <44ml/min will need to reduce maintenance dose to 30mcg/kg/min. Planned duration of therapy is 24 hours.  Goal of Therapy:  Monitor platelets by anticoagulation protocol: Yes   Plan:  Continue Integrilin at 63mcg/kg/min CBC 8 hours after infusion started and in am  Sheppard Coil PharmD., BCPS Clinical Pharmacist Pager [phone] 06/14/2013 2:12 PM

## 2013-06-15 ENCOUNTER — Telehealth: Payer: Self-pay | Admitting: Surgery

## 2013-06-15 ENCOUNTER — Observation Stay (HOSPITAL_COMMUNITY): Payer: Medicare (Managed Care)

## 2013-06-15 LAB — CBC
HCT: 34.4 % — ABNORMAL LOW (ref 36.0–46.0)
MCHC: 33.4 g/dL (ref 30.0–36.0)
RDW: 15 % (ref 11.5–15.5)

## 2013-06-15 MED ORDER — RESOURCE THICKENUP CLEAR PO POWD
ORAL | Status: DC | PRN
  Filled 2013-06-15: qty 125

## 2013-06-15 MED ORDER — CLOPIDOGREL BISULFATE 75 MG PO TABS: 75.0000 mg | ORAL_TABLET | Freq: Every day | ORAL | Status: AC

## 2013-06-15 NOTE — Progress Notes (Signed)
[facility]  Subjective  -   S/p Right leg angio with atherectomy and PTA.  SHe had embolization which affected the very small arteries out onto her foot.  Because of her age and health issues, I thought she was a poor candidate for TPA. Therefore, I elected to place her on an Integrilin drip overnight. She has had no complaints. She did have troubles with swallowing overnight which according to the family is a chronic problem   Physical Exam:  Awake and alert Respirations are nonlabored Right dorsalis pedis pulse is palpable. The foot is warm and well perfused. She continues to have ulceration on the right second toe       Assessment/Plan:    Peripheral vascular disease: Very pleased with the results of the integral an overnight. This will be discontinued later today and she will be a candidate for discharge home  The patient had issues with swallowing. She was seen and evaluated by speech therapy. She will have a modified barium swallow later today.  Once her swallowing issues have resolved, she can be discharged to home.   I will at [facility] to her discharge medications. She will need to come back in 3 weeks for a repeat angiogram to treat the left leg which also has ulceration. BRABHAM IV, V. WELLS 06/15/2013 11:13 AM --  Filed Vitals:   06/15/13 1011  BP: 132/59  Pulse: 82  Temp:   Resp: 23    Intake/Output Summary (Last 24 hours) at 06/15/13 1113 Last data filed at 06/15/13 1000  Gross per 24 hour  Intake 1182.58 ml  Output    775 ml  Net 407.58 ml     Laboratory CBC    Component Value Date/Time   WBC 9.2 06/15/2013 0510   WBC 7.8 07/09/2011 1432   HGB 11.5* 06/15/2013 0510   HGB 13.3 07/09/2011 1432   HCT 34.4* 06/15/2013 0510   HCT 39.7 07/09/2011 1432   PLT 254 06/15/2013 0510   PLT 319 07/09/2011 1432    BMET    Component Value Date/Time   NA 143 06/14/2013 0730   K 3.2* 06/14/2013 0730   CL 108 06/14/2013 0730   CO2 26  04/07/2013 1218   GLUCOSE 65* 06/14/2013 0730   BUN 18 06/14/2013 0730   CREATININE 1.00 06/14/2013 0730   CALCIUM 9.7 04/07/2013 1218   GFRNONAA 58* 04/07/2013 1218   GFRAA 67* 04/07/2013 1218    COAG Lab Results  Component Value Date   INR 1.55* 04/07/2013   INR 4.1 08/20/2012   INR 2.6 07/23/2012   No results found for this basename: PTT    Antibiotics Anti-infectives   None       V. Charlena Cross, M.D. [facility] Office: [phone] Pager:  [phone]

## 2013-06-15 NOTE — Evaluation (Signed)
Clinical/Bedside Swallow Evaluation Patient Details  Name: Alyssa Waters MRN: [id number] Date of Birth: 09-Apr-1931  Today's Date: 06/15/2013 Time: 5284-1324 SLP Time Calculation (min): 12 min  Past Medical History:  Past Medical History  Diagnosis Date  . HYPERTENSION 08/09/2007  . GERD (gastroesophageal reflux disease)   . Rheumatoid arthritis(714.0)   . Endocarditis   . PE (pulmonary embolism)   . Hyperlipidemia   . Shortness of breath     exertion  . Depression   . Stroke     left sided weakness  . Dizziness    Past Surgical History:  Past Surgical History  Procedure Laterality Date  . Abdominal hysterectomy    . Tubal ligation    . Tonsillectomy    . Varicose vein surgery  1971  . Rotator cuff repair  1986    right  . Rectocele repair  1998  . Peg tube placement  04/2002    removed as well  . Breast lumpectomy  2007    right  . Wrist surgery Left   . Fracture surgery     HPI:  79 yr old seen at PCP office (06/06/13) for evaluation of bilateral recurrent toe ulcers, right second toe greater than left. According to the patient however these have all been there for a couple days and she has never had them before. She denies having any significant pain.  Underwent the following procedures 7/1 ultrasound-guided access, left femoral artery, abdominal aortogram, bilateral lower extremity runoff, third order catheterization, atherectomy, right popliteal artery, angioplasty right popliteal artery, intra-arterial administration of nitroglycerin and Integrilin.  PMH:  GERD, HTN, PE SOB, CVA   Assessment / Plan / Recommendation Clinical Impression  Referred for BSE after coughing with RN yesterday with juice.  RN reports family stated she frequently coughs at home during meals.  Today she immediately coughed after ice chip and tsp size water.  In 2005 pt. had three MBS, however results not available and reports of having PEG (?).  Pt. would benefit from MBS to further assess swallow  functionl    Aspiration Risk  Moderate    Diet Recommendation NPO        Other  Recommendations Recommended Consults: MBS Oral Care Recommendations: Oral care BID   Follow Up Recommendations   (TBD)    Frequency and Duration        Pertinent Vitals/Pain No indications         Swallow Study Prior Functional Status       General HPI: 79 yr old seen at PCP office (06/06/13) for evaluation of bilateral recurrent toe ulcers, right second toe greater than left. According to the patient however these have all been there for a couple days and she has never had them before. She denies having any significant pain.  Underwent the following procedures 7/1 ultrasound-guided access, left femoral artery, abdominal aortogram, bilateral lower extremity runoff, third order catheterization, atherectomy, right popliteal artery, angioplasty right popliteal artery, intra-arterial administration of nitroglycerin and Integrilin.  PMH:  GERD, HTN, PE SOB, CVA Type of Study: Bedside swallow evaluation Previous Swallow Assessment:  (3 MBS' in 2005 (time of CVA)) Diet Prior to this Study: NPO Temperature Spikes Noted: No Respiratory Status: Room air History of Recent Intubation: No Behavior/Cognition: Alert;Cooperative;Requires cueing Oral Cavity - Dentition:  (most natural dentition, missing lower and upper back) Patient Positioning: Upright in bed Baseline Vocal Quality: Clear Volitional Cough: Strong (moderately strong) Volitional Swallow: Able to elicit    Oral/Motor/Sensory Function Overall Oral Motor/Sensory Function:  Impaired at baseline Labial ROM: Reduced left Labial Symmetry: Abnormal symmetry left Lingual ROM: Reduced left Lingual Symmetry: Abnormal symmetry left   Ice Chips Ice chips: Impaired Presentation: Spoon Pharyngeal Phase Impairments: Cough - Immediate   Thin Liquid Thin Liquid: Impaired Presentation: Spoon Pharyngeal  Phase Impairments: Cough - Immediate    Nectar Thick  Nectar Thick Liquid: Not tested   Honey Thick Honey Thick Liquid: Not tested   Puree Puree: Not tested   Solid   GO    Solid: Not tested       Royce Macadamia M.Ed [employer] [phone]  06/15/2013

## 2013-06-15 NOTE — Telephone Encounter (Addendum)
Message copied by Rosalyn Charters on Wed Jun 15, 2013  4:56 PM ------      Message from: Lorin Mercy K      Created: Wed Jun 15, 2013  3:19 PM      Regarding: schedule                   ----- Message -----         From: Dara Lords, PA-C         Sent: 06/15/2013   1:44 PM           To: Sharee Pimple, CMA            S/p:       1.  ultrasound-guided access, left femoral artery                    2.  abdominal aortogram                  3.  bilateral lower extremity runoff                  4.  third order catheterization                  5.  atherectomy, right popliteal artery                  6.  angioplasty right popliteal artery                  7.  intra-arterial administration of nitroglycerin and Integrilin      F/u with Dr. Myra Gianotti in 3 weeks.            Thanks,      Samantha ------  NOTIFIED SONYA  AT [facility] [phone]  PATIENT OF FU APPT. WITH DR. Myra Gianotti ON 07-11-13 AT 1:45

## 2013-06-15 NOTE — Procedures (Signed)
Objective Swallowing Evaluation: Modified Barium Swallowing Study  Patient Details  Name: Alyssa Waters MRN: [id number] Date of Birth: [birth year]  Today's Date: 06/15/2013 Time: 9147-8295 SLP Time Calculation (min): 45 min  Past Medical History:  Past Medical History  Diagnosis Date  . HYPERTENSION 08/09/2007  . GERD (gastroesophageal reflux disease)   . Rheumatoid arthritis(714.0)   . Endocarditis   . PE (pulmonary embolism)   . Hyperlipidemia   . Shortness of breath     exertion  . Depression   . Stroke     left sided weakness  . Dizziness    Past Surgical History:  Past Surgical History  Procedure Laterality Date  . Abdominal hysterectomy    . Tubal ligation    . Tonsillectomy    . Varicose vein surgery  1971  . Rotator cuff repair  1986    right  . Rectocele repair  1998  . Peg tube placement  04/2002    removed as well  . Breast lumpectomy  2007    right  . Wrist surgery Left   . Fracture surgery     HPI:  79 yr old seen at PCP office (06/06/13) for evaluation of bilateral recurrent toe ulcers, right second toe greater than left. According to the patient however these have all been there for a couple days and she has never had them before. She denies having any significant pain.  Underwent the following procedures 7/1 ultrasound-guided access, left femoral artery, abdominal aortogram, bilateral lower extremity runoff, third order catheterization, atherectomy, right popliteal artery, angioplasty right popliteal artery, intra-arterial administration of nitroglycerin and Integrilin.  PMH:  GERD, HTN, PE SOB, CVA     Assessment / Plan / Recommendation Clinical Impression  Dysphagia Diagnosis: Mild oral phase dysphagia;Severe pharyngeal phase dysphagia Clinical impression: Pt. exhibits a chronic moderate to severe sensori-motor oropharyngeal dysphagia, characterized by decreased laryngeal elevation, resulting in penetration with all consistencies.  Thin liquids were  penetrated to the level of the cords, while all other consistencies were penetrated and cleared with SLP's verbal cue to "cough."  There was no spontaneous cough with penetration, indicating the times pt. does cough, she is most likely aspirating.  Even semi-solids (a particle of the cereal bar given) penetrated the airway and pt. required cues to cough and clear.  Pt. is a risk to aspirate with any consistency, but with chin tuck and double swallows, the risk is somewhat reduced.  In-depth education with the family was completed.  The son and daughter indicate they are willing to accept risks associated with aspiration, and allow pt. to eat and drink "the best she can."  Thus far, the pt. has not developed pneumonia, despite the chronic nature of her dysphagia and aspiration.  The pt. is not able to recall compensatory strategies on her own and will need full supervision with all po's to follow chin tuck and double swallows.    Treatment Recommendation  Defer treatment plan to SLP at (Comment) ([facility])    Diet Recommendation Dysphagia 2 (Fine chop);Nectar-thick liquid;No mixed consistencies   Liquid Administration via: Cup;No straw Medication Administration: Whole meds with puree Supervision: Staff feed patient;Full supervision/cueing for compensatory strategies;Trained caregiver to feed patient Compensations: Slow rate;Small sips/bites;Multiple dry swallows after each bite/sip;Clear throat intermittently Postural Changes and/or Swallow Maneuvers: Seated upright 90 degrees;Out of bed for meals    Other  Recommendations Oral Care Recommendations: Oral care QID;Staff/trained caregiver to provide oral care Other Recommendations: Order thickener from pharmacy;Prohibited food (jello, ice cream, thin soups);Clarify  dietary restrictions   Follow Up Recommendations  Home health SLP ([facility] SLP)    Frequency and Duration        Pertinent Vitals/Pain n/a    SLP Swallow Goals  Defer to Home Health  SLP   General Date of Onset:  (Chronic) HPI: 79 yr old seen at PCP office (06/06/13) for evaluation of bilateral recurrent toe ulcers, right second toe greater than left. According to the patient however these have all been there for a couple days and she has never had them before. She denies having any significant pain.  Underwent the following procedures 7/1 ultrasound-guided access, left femoral artery, abdominal aortogram, bilateral lower extremity runoff, third order catheterization, atherectomy, right popliteal artery, angioplasty right popliteal artery, intra-arterial administration of nitroglycerin and Integrilin.  PMH:  GERD, HTN, PE SOB, CVA Type of Study: Modified Barium Swallowing Study Reason for Referral: Objectively evaluate swallowing function Previous Swallow Assessment: 2005; Had PEG placed in 2003 after CVA Diet Prior to this Study: NPO Temperature Spikes Noted: No Respiratory Status: Room air History of Recent Intubation: No Behavior/Cognition: Alert;Cooperative;Requires cueing;Decreased sustained attention Oral Cavity - Dentition: Adequate natural dentition;Missing dentition Oral Motor / Sensory Function: Impaired - see Bedside swallow eval (Impaired at baseline) Self-Feeding Abilities: Needs set up Patient Positioning: Upright in chair Baseline Vocal Quality: Clear Volitional Cough: Strong Volitional Swallow: Able to elicit Anatomy: Within functional limits Pharyngeal Secretions: Not observed secondary MBS    Reason for Referral Objectively evaluate swallowing function   Oral Phase Oral Preparation/Oral Phase Oral Phase: WFL   Pharyngeal Phase Pharyngeal Phase Pharyngeal Phase: Impaired Pharyngeal - Pudding Pharyngeal - Pudding Teaspoon: Penetration/Aspiration during swallow;Reduced laryngeal elevation Penetration/Aspiration details (pudding teaspoon): Material enters airway, remains ABOVE vocal cords then ejected out Pharyngeal - Honey Pharyngeal - Honey  Teaspoon: Reduced laryngeal elevation;Penetration/Aspiration during swallow Penetration/Aspiration details (honey teaspoon): Material enters airway, remains ABOVE vocal cords then ejected out Pharyngeal - Nectar Pharyngeal - Nectar Teaspoon: Reduced laryngeal elevation;Penetration/Aspiration during swallow Penetration/Aspiration details (nectar teaspoon): Material enters airway, remains ABOVE vocal cords then ejected out Pharyngeal - Thin Pharyngeal - Thin Teaspoon: Reduced laryngeal elevation;Penetration/Aspiration during swallow;Pharyngeal residue - valleculae Penetration/Aspiration details (thin teaspoon): Material enters airway, CONTACTS cords and not ejected out Pharyngeal - Solids Pharyngeal - Mechanical Soft: Reduced laryngeal elevation;Penetration/Aspiration during swallow Penetration/Aspiration details (mechanical soft): Material enters airway, remains ABOVE vocal cords then ejected out (When cued by SLP to "cough.)  Cervical Esophageal Phase    GO    Cervical Esophageal Phase Cervical Esophageal Phase: [facility]    Functional Assessment Tool Used: clinical judgement Functional Limitations: Swallowing Swallow Goal Status ([id number]): At least 20 percent but less than 40 percent impaired, limited or restricted Swallow Discharge Status [phone]): At least 40 percent but less than 60 percent impaired, limited or restricted    Maryjo Rochester T 06/15/2013, 2:52 PM  Objective Swallowing Evaluation: Modified Barium Swallowing Study  Patient Details  Name: Alyssa Waters MRN: [id number] Date of Birth: July 13, 1931  Today's Date: 06/15/2013 Time: 8469-6295 SLP Time Calculation (min): 45 min  Past Medical History:  Past Medical History  Diagnosis Date  . HYPERTENSION 08/09/2007  . GERD (gastroesophageal reflux disease)   . Rheumatoid arthritis(714.0)   . Endocarditis   . PE (pulmonary embolism)   . Hyperlipidemia   . Shortness of breath     exertion  . Depression   . Stroke     left  sided weakness  . Dizziness    Past Surgical History:  Past Surgical History  Procedure Laterality  Date  . Abdominal hysterectomy    . Tubal ligation    . Tonsillectomy    . Varicose vein surgery  1971  . Rotator cuff repair  1986    right  . Rectocele repair  1998  . Peg tube placement  04/2002    removed as well  . Breast lumpectomy  2007    right  . Wrist surgery Left   . Fracture surgery     HPI:  79 yr old seen at PCP office (06/06/13) for evaluation of bilateral recurrent toe ulcers, right second toe greater than left. According to the patient however these have all been there for a couple days and she has never had them before. She denies having any significant pain.  Underwent the following procedures 7/1 ultrasound-guided access, left femoral artery, abdominal aortogram, bilateral lower extremity runoff, third order catheterization, atherectomy, right popliteal artery, angioplasty right popliteal artery, intra-arterial administration of nitroglycerin and Integrilin.  PMH:  GERD, HTN, PE SOB, CVA     Assessment / Plan / Recommendation Clinical Impression  Dysphagia Diagnosis: Mild oral phase dysphagia;Severe pharyngeal phase dysphagia Clinical impression: Pt. exhibits a chronic moderate to severe sensori-motor oropharyngeal dysphagia, characterized by decreased laryngeal elevation, resulting in penetration with all consistencies.  Thin liquids were penetrated to the level of the cords, while all other consistencies were penetrated and cleared with SLP's verbal cue to "cough."  There was no spontaneous cough with penetration, indicating the times pt. does cough, she is most likely aspirating.  Even semi-solids (a particle of the cereal bar given) penetrated the airway and pt. required cues to cough and clear.  Pt. is a risk to aspirate with any consistency, but with chin tuck and double swallows, the risk is somewhat reduced.  In-depth education with the family was completed.  The son  and daughter indicate they are willing to accept risks associated with aspiration, and allow pt. to eat and drink "the best she can."  Thus far, the pt. has not developed pneumonia, despite the chronic nature of her dysphagia and aspiration.  The pt. is not able to recall compensatory strategies on her own and will need full supervision with all po's to follow chin tuck and double swallows.    Treatment Recommendation  Defer treatment plan to SLP at (Comment) ([facility])    Diet Recommendation Dysphagia 2 (Fine chop);Nectar-thick liquid;No mixed consistencies   Liquid Administration via: Cup;No straw Medication Administration: Whole meds with puree Supervision: Staff feed patient;Full supervision/cueing for compensatory strategies;Trained caregiver to feed patient Compensations: Slow rate;Small sips/bites;Multiple dry swallows after each bite/sip;Clear throat intermittently Postural Changes and/or Swallow Maneuvers: Seated upright 90 degrees;Out of bed for meals    Other  Recommendations Oral Care Recommendations: Oral care QID;Staff/trained caregiver to provide oral care Other Recommendations: Order thickener from pharmacy;Prohibited food (jello, ice cream, thin soups);Clarify dietary restrictions   Follow Up Recommendations  Home health SLP ([facility] SLP)    Frequency and Duration        Pertinent Vitals/Pain n/a        General Date of Onset:  (Chronic) HPI: 79 yr old seen at PCP office (06/06/13) for evaluation of bilateral recurrent toe ulcers, right second toe greater than left. According to the patient however these have all been there for a couple days and she has never had them before. She denies having any significant pain.  Underwent the following procedures 7/1 ultrasound-guided access, left femoral artery, abdominal aortogram, bilateral lower extremity runoff, third order catheterization, atherectomy, right  popliteal artery, angioplasty right popliteal artery, intra-arterial  administration of nitroglycerin and Integrilin.  PMH:  GERD, HTN, PE SOB, CVA Type of Study: Modified Barium Swallowing Study Reason for Referral: Objectively evaluate swallowing function Previous Swallow Assessment: 2005; Had PEG placed in 2003 after CVA Diet Prior to this Study: NPO Temperature Spikes Noted: No Respiratory Status: Room air History of Recent Intubation: No Behavior/Cognition: Alert;Cooperative;Requires cueing;Decreased sustained attention Oral Cavity - Dentition: Adequate natural dentition;Missing dentition Oral Motor / Sensory Function: Impaired - see Bedside swallow eval (Impaired at baseline) Self-Feeding Abilities: Needs set up Patient Positioning: Upright in chair Baseline Vocal Quality: Clear Volitional Cough: Strong Volitional Swallow: Able to elicit Anatomy: Within functional limits Pharyngeal Secretions: Not observed secondary MBS    Reason for Referral Objectively evaluate swallowing function   Oral Phase Oral Preparation/Oral Phase Oral Phase: WFL   Pharyngeal Phase Pharyngeal Phase Pharyngeal Phase: Impaired Pharyngeal - Pudding Pharyngeal - Pudding Teaspoon: Penetration/Aspiration during swallow;Reduced laryngeal elevation Penetration/Aspiration details (pudding teaspoon): Material enters airway, remains ABOVE vocal cords then ejected out Pharyngeal - Honey Pharyngeal - Honey Teaspoon: Reduced laryngeal elevation;Penetration/Aspiration during swallow Penetration/Aspiration details (honey teaspoon): Material enters airway, remains ABOVE vocal cords then ejected out Pharyngeal - Nectar Pharyngeal - Nectar Teaspoon: Reduced laryngeal elevation;Penetration/Aspiration during swallow Penetration/Aspiration details (nectar teaspoon): Material enters airway, remains ABOVE vocal cords then ejected out Pharyngeal - Thin Pharyngeal - Thin Teaspoon: Reduced laryngeal elevation;Penetration/Aspiration during swallow;Pharyngeal residue -  valleculae Penetration/Aspiration details (thin teaspoon): Material enters airway, CONTACTS cords and not ejected out Pharyngeal - Solids Pharyngeal - Mechanical Soft: Reduced laryngeal elevation;Penetration/Aspiration during swallow Penetration/Aspiration details (mechanical soft): Material enters airway, remains ABOVE vocal cords then ejected out (When cued by SLP to "cough.)  Cervical Esophageal Phase    GO    Cervical Esophageal Phase Cervical Esophageal Phase: [facility]    Functional Assessment Tool Used: clinical judgement Functional Limitations: Swallowing Swallow Goal Status (Z6109): At least 20 percent but less than 40 percent impaired, limited or restricted Swallow Discharge Status [phone]): At least 40 percent but less than 60 percent impaired, limited or restricted    Maryjo Rochester T 06/15/2013, 2:52 PM

## 2013-06-16 NOTE — Care Management Note (Signed)
    Page 1 of 1   06/16/2013     8:23:15 AM   CARE MANAGEMENT NOTE 06/16/2013  Patient:  Alyssa Waters, Alyssa Waters   Account Number:  [id number]  Date Initiated:  06/16/2013  Documentation initiated by:  Junius Creamer  Subjective/Objective Assessment:     Action/Plan:   was disch. spoke w fam, [facility] w [facility]. [facility] aid and goes to [facility].   Anticipated DC Date:  06/15/2013   Anticipated DC Plan:  HOME W HOME HEALTH SERVICES      DC Planning Services  CM consult      Choice offered to / List presented to:          [facility] arranged  HH-5 SPEECH THERAPY      HH agency  OTHER - SEE NOTE   Status of service:   Medicare Important Message given?   (If response is "NO", the following Medicare IM given date fields will be blank) Date Medicare IM given:   Date Additional Medicare IM given:    Discharge Disposition:  HOME W HOME HEALTH SERVICES  Per UR Regulation:  Reviewed for med. necessity/level of care/duration of stay  If discussed at Long Length of Stay Meetings, dates discussed:    Comments:  7/3 0821 debbie Bentli Llorente rn,bsn md order for [facility] sp ther. spoke w fam and pt act w [facility]. spoke w neidra baldwin at [facility] and they will arrange for speech therapy. orders faxed to [phone] [facility] and they will arrange sp ther.

## 2013-06-24 NOTE — Discharge Summary (Signed)
[facility] Discharge Summary   Patient ID:  Alyssa Waters MRN: [id number] DOB/AGE: [birth year] 79 y.o.  Admit date: 06/14/2013 Discharge date: 06/24/2013 Date of Surgery: 06/15/2013 Surgeon: Surgeon(s): Nada Libman, MD  Admission Diagnosis: pvd with ulcer  Discharge Diagnoses:  pvd with ulcer  Secondary Diagnoses: Past Medical History  Diagnosis Date  . HYPERTENSION 08/09/2007  . GERD (gastroesophageal reflux disease)   . Rheumatoid arthritis(714.0)   . Endocarditis   . PE (pulmonary embolism)   . Hyperlipidemia   . Shortness of breath     exertion  . Depression   . Stroke     left sided weakness  . Dizziness     Procedure(s): ABDOMINAL AORTAGRAM ANGIOGRAM EXTREMITY BILATERAL ATHERECTOMY PERIPHERAL ARTERY PTA FEMORAL POPLITEAL ARTERY  Discharged Condition: good  HPI: The patient comes in today for evaluation of bilateral recurrent toe ulcers, right second toe greater than left. According to the patient however these have all been there for a couple days and she has never had them before. She denies having any significant pain.  The patient carries a diagnosis of hyperlipidemia. She is not taking a statin. She has a history of pulmonary embolism. She is supposed to be on lifelong anticoagulations. She has a history of stroke with residual left-sided weakness. She also has a history of endocarditis.     06/14/2013 Dr. Myra Gianotti performed:  1. ultrasound-guided access, left femoral artery  2. abdominal aortogram  3. bilateral lower extremity runoff  4. third order catheterization  5. atherectomy, right popliteal artery  6. angioplasty right popliteal artery  7. intra-arterial administration of nitroglycerin and Integrilin  S/p Right leg angio with atherectomy and PTA. SHe had embolization which affected the very small arteries out onto her foot. Because of her age and health issues, I thought she was a poor candidate for TPA. Therefore, I elected to  place her on an Integrilin drip overnight. She has had no complaints. She did have troubles with swallowing overnight which according to the family is a chronic problem  Consult was done by Royce Macadamia, CCC-SLP. Diet recommendation were made prior to discharge Dysphagia 2 (Fine chop);Nectar-thick liquid;No mixed consistencies.  Follow up was arranged for Home health S[facility] ([facility]).  She was discharged home.  Plavix was added to her medication 75 mg po QD prior to discharge.   Hospital Course:  Alyssa Waters is a 79 y.o. female is S/P Right Procedure(s): ABDOMINAL AORTAGRAM ANGIOGRAM EXTREMITY BILATERAL ATHERECTOMY PERIPHERAL ARTERY PTA FEMORAL POPLITEAL ARTERY Extubated: POD # 0 Physical exam: Awake and alert  Respirations are nonlabored  Right dorsalis pedis pulse is palpable. The foot is warm and well perfused.  She continues to have ulceration on the right second toe  Post-op wounds clean, dry, intact Pt. Ambulating, voiding and taking PO diet without difficulty. Pt pain controlled with PO pain meds. Labs as below Complications:see hospital course  Consults:     Significant Diagnostic Studies: CBC Lab Results  Component Value Date   WBC 9.2 06/15/2013   HGB 11.5* 06/15/2013   HCT 34.4* 06/15/2013   MCV 86.2 06/15/2013   PLT 254 06/15/2013    BMET    Component Value Date/Time   NA 143 06/14/2013 0730   K 3.2* 06/14/2013 0730   CL 108 06/14/2013 0730   CO2 26 04/07/2013 1218   GLUCOSE 65* 06/14/2013 0730   BUN 18 06/14/2013 0730   CREATININE 1.00 06/14/2013 0730   CALCIUM 9.7 04/07/2013 1218   GFRNONAA 58*  04/07/2013 1218   GFRAA 67* 04/07/2013 1218   COAG Lab Results  Component Value Date   INR 1.55* 04/07/2013   INR 4.1 08/20/2012   INR 2.6 07/23/2012     Disposition:  Discharge to :Home Discharge Orders   Future Appointments Provider Department Dept Phone   07/11/2013 1:45 PM Nada Libman, MD [facility] [phone]   Future Orders  Complete By Expires     Call MD for:  redness, tenderness, or signs of infection (pain, swelling, bleeding, redness, odor or green/yellow discharge around incision site)  As directed     Call MD for:  severe or increased pain, loss or decreased feeling  in affected limb(s)  As directed     Call MD for:  temperature >100.5  As directed     Discharge patient  As directed     Comments:      Discharge pt to home    Driving Restrictions  As directed     Comments:      No driving for 24 hours and while taking pain medication.    Face-to-face encounter (required for Medicare/Medicaid patients)  As directed     Comments:      I Erum Cercone MAUREEN certify that this patient is under my care and that I, or a nurse practitioner or physician's assistant working with me, had a face-to-face encounter that meets the physician face-to-face encounter requirements with this patient on 06/15/2013. The encounter with the patient was in whole, or in part for the following medical condition(s) which is the primary reason for home health care (List medical condition): Unsafe swallowing.  MBS study performed here today.    Questions:      The encounter with the patient was in whole, or in part, for the following medical condition, which is the primary reason for home health care:  dysphasia worsening    I certify that, based on my findings, the following services are medically necessary home health services:  Speech language pathology    My clinical findings support the need for the above services:  OTHER SEE COMMENTS    Further, I certify that my clinical findings support that this patient is homebound due to:  Unsafe ambulation due to balance issues    Reason for Medically Necessary Home Health Services:  Therapy- Skilled Evaluation of Speech Comprehension and Safe Swallowing    Home Health  As directed     Questions:      To provide the following care/treatments:  SLP    Lifting restrictions  As directed      Comments:      No lifting for 4 weeks    Resume previous diet  As directed     may wash over wound with mild soap and water  As directed         Medication List         alendronate 70 MG tablet  Commonly known as:  FOSAMAX  Take 70 mg by mouth every Saturday. Take with a full glass of water on an empty stomach.     amLODipine 10 MG tablet  Commonly known as:  NORVASC  Take 10 mg by mouth daily.     aspirin 81 MG tablet  Take 81 mg by mouth daily.     clopidogrel 75 MG tablet  Commonly known as:  PLAVIX  Take 1 tablet (75 mg total) by mouth daily.     ENSURE  Take 237 mLs  by mouth 2 (two) times daily between meals.     EPSOM SALT Powd  Generic drug:  Magnesium Sulfate  by Does not apply route. Soak Left foot only in solution of quarter cup epsom salts dissolved in 4 cups of warm water once a day     FLUoxetine 20 MG capsule  Commonly known as:  PROZAC  Take 20 mg by mouth daily.     gabapentin 300 MG capsule  Commonly known as:  NEURONTIN  Take 300 mg by mouth daily as needed (hot flashes).     HCA TRIPLE ANTIBIOTIC OINTMENT EX  Apply topically. Apply to toe wound during dressing changes     HYDROcodone-acetaminophen 5-325 MG per tablet  Commonly known as:  NORCO/VICODIN  Take 1 tablet by mouth every 8 (eight) hours as needed for pain.     leflunomide 20 MG tablet  Commonly known as:  ARAVA  Take 20 mg by mouth daily.     multivitamin with minerals Tabs  Take 1 tablet by mouth daily.     mupirocin ointment 2 %  Commonly known as:  BACTROBAN  Apply 1 application topically 2 (two) times daily. To feet     PeriGuard Oint  Apply 1 application topically daily. To bottom     predniSONE 5 MG tablet  Commonly known as:  DELTASONE  Take 5 mg by mouth daily.     VITAMIN D-3 PO  Take 1 tablet by mouth daily.       Verbal and written Discharge instructions given to the patient. Wound care per Discharge AVS     Follow-up Information   Follow up with Myra Gianotti  IV, Lala Lund, MD In 3 weeks. (Office will call you to arrange your appt (sent))    Contact information:   [address] [place] [address] [phone]      Home health [facility] ([facility])     Signed: Clinton Gallant [facility] 06/24/2013, 7:22 AM

## 2013-06-26 NOTE — Discharge Summary (Signed)
I agree with the above  Wells Giavana Rooke 

## 2013-06-28 ENCOUNTER — Other Ambulatory Visit: Payer: Self-pay | Admitting: *Deleted

## 2013-06-28 ENCOUNTER — Encounter: Payer: Self-pay | Admitting: *Deleted

## 2013-07-01 ENCOUNTER — Ambulatory Visit (HOSPITAL_COMMUNITY): Admit: 2013-07-01 | Payer: Medicare (Managed Care) | Admitting: Surgery

## 2013-07-01 ENCOUNTER — Encounter (HOSPITAL_COMMUNITY): Payer: Self-pay

## 2013-07-01 SURGERY — ANGIOGRAM, LOWER EXTREMITY
Anesthesia: LOCAL

## 2013-07-11 ENCOUNTER — Ambulatory Visit: Payer: Medicare (Managed Care) | Admitting: Surgery

## 2013-07-12 ENCOUNTER — Ambulatory Visit (HOSPITAL_COMMUNITY)
Admission: RE | Admit: 2013-07-12 | Discharge: 2013-07-12 | Disposition: A | Discharge status: Home / Self Care | Payer: Medicare (Managed Care) | Source: Ambulatory Visit | Attending: Surgery | Admitting: Surgery

## 2013-07-12 ENCOUNTER — Encounter (HOSPITAL_COMMUNITY)
Admission: RE | Disposition: A | Discharge status: Home / Self Care | Payer: Self-pay | Source: Ambulatory Visit | Attending: Surgery

## 2013-07-12 DIAGNOSIS — L98499 Non-pressure chronic ulcer of skin of other sites with unspecified severity: Secondary | ICD-10-CM | POA: Insufficient documentation

## 2013-07-12 DIAGNOSIS — K219 Gastro-esophageal reflux disease without esophagitis: Secondary | ICD-10-CM | POA: Insufficient documentation

## 2013-07-12 DIAGNOSIS — L97909 Non-pressure chronic ulcer of unspecified part of unspecified lower leg with unspecified severity: Secondary | ICD-10-CM | POA: Insufficient documentation

## 2013-07-12 DIAGNOSIS — Z79899 Other long term (current) drug therapy: Secondary | ICD-10-CM | POA: Insufficient documentation

## 2013-07-12 DIAGNOSIS — E785 Hyperlipidemia, unspecified: Secondary | ICD-10-CM | POA: Insufficient documentation

## 2013-07-12 DIAGNOSIS — Z8673 Personal history of transient ischemic attack (TIA), and cerebral infarction without residual deficits: Secondary | ICD-10-CM | POA: Insufficient documentation

## 2013-07-12 DIAGNOSIS — I10 Essential (primary) hypertension: Secondary | ICD-10-CM | POA: Insufficient documentation

## 2013-07-12 DIAGNOSIS — I70219 Atherosclerosis of native arteries of extremities with intermittent claudication, unspecified extremity: Secondary | ICD-10-CM

## 2013-07-12 DIAGNOSIS — Z86711 Personal history of pulmonary embolism: Secondary | ICD-10-CM | POA: Insufficient documentation

## 2013-07-12 DIAGNOSIS — Z8249 Family history of ischemic heart disease and other diseases of the circulatory system: Secondary | ICD-10-CM | POA: Insufficient documentation

## 2013-07-12 DIAGNOSIS — Z823 Family history of stroke: Secondary | ICD-10-CM | POA: Insufficient documentation

## 2013-07-12 DIAGNOSIS — I739 Peripheral vascular disease, unspecified: Secondary | ICD-10-CM | POA: Insufficient documentation

## 2013-07-12 DIAGNOSIS — Z7902 Long term (current) use of antithrombotics/antiplatelets: Secondary | ICD-10-CM | POA: Insufficient documentation

## 2013-07-12 DIAGNOSIS — Z87891 Personal history of nicotine dependence: Secondary | ICD-10-CM | POA: Insufficient documentation

## 2013-07-12 DIAGNOSIS — M069 Rheumatoid arthritis, unspecified: Secondary | ICD-10-CM | POA: Insufficient documentation

## 2013-07-12 DIAGNOSIS — I743 Embolism and thrombosis of arteries of the lower extremities: Secondary | ICD-10-CM

## 2013-07-12 HISTORY — PX: ATHERECTOMY: SHX5502

## 2013-07-12 HISTORY — PX: LOWER EXTREMITY ANGIOGRAM: SHX5508

## 2013-07-12 LAB — POCT I-STAT, CHEM 8
BUN: 21 mg/dL (ref 6–23)
Calcium, Ion: 0.9 mmol/L — ABNORMAL LOW (ref 1.13–1.30)
Chloride: 105 meq/L (ref 96–112)
Creatinine, Ser: 1.2 mg/dL — ABNORMAL HIGH (ref 0.50–1.10)
Glucose, Bld: 80 mg/dL (ref 70–99)
HCT: 45 % (ref 36.0–46.0)
Hemoglobin: 15.3 g/dL — ABNORMAL HIGH (ref 12.0–15.0)
Potassium: 3.2 meq/L — ABNORMAL LOW (ref 3.5–5.1)
Sodium: 140 meq/L (ref 135–145)
TCO2: 27 mmol/L (ref 0–100)

## 2013-07-12 SURGERY — ANGIOGRAM, LOWER EXTREMITY
Anesthesia: LOCAL

## 2013-07-12 SURGERY — ANGIOGRAM, LOWER EXTREMITY
Anesthesia: LOCAL | Laterality: Left

## 2013-07-12 MED ORDER — ACETAMINOPHEN 325 MG PO TABS: 325.0000 mg | ORAL_TABLET | ORAL | Status: DC | PRN

## 2013-07-12 MED ORDER — FAMOTIDINE IN NACL 20-0.9 MG/50ML-% IV SOLN
20.0000 mg | Freq: Two times a day (BID) | INTRAVENOUS | Status: DC
  Administered 2013-07-12: 20 mg via INTRAVENOUS

## 2013-07-12 MED ORDER — ASPIRIN 81 MG PO CHEW
CHEWABLE_TABLET | ORAL | Status: AC
  Filled 2013-07-12: qty 4

## 2013-07-12 MED ORDER — HYDRALAZINE HCL 20 MG/ML IJ SOLN: 10.0000 mg | INTRAMUSCULAR | Status: DC | PRN

## 2013-07-12 MED ORDER — HYDRALAZINE HCL 20 MG/ML IJ SOLN
INTRAMUSCULAR | Status: AC
  Filled 2013-07-12: qty 1

## 2013-07-12 MED ORDER — FENTANYL CITRATE 0.05 MG/ML IJ SOLN
INTRAMUSCULAR | Status: AC
  Filled 2013-07-12: qty 2

## 2013-07-12 MED ORDER — PHENOL 1.4 % MT LIQD: 1.0000 | OROMUCOSAL | Status: DC | PRN

## 2013-07-12 MED ORDER — ALUM & MAG HYDROXIDE-SIMETH 200-200-20 MG/5ML PO SUSP
15.0000 mL | ORAL | Status: DC | PRN
Start: 2013-07-12 — End: 2013-07-12

## 2013-07-12 MED ORDER — ACETAMINOPHEN 325 MG RE SUPP: 325.0000 mg | RECTAL | Status: DC | PRN

## 2013-07-12 MED ORDER — HEPARIN (PORCINE) IN NACL 2-0.9 UNIT/ML-% IJ SOLN
INTRAMUSCULAR | Status: AC
  Filled 2013-07-12: qty 1000

## 2013-07-12 MED ORDER — ASPIRIN 81 MG PO CHEW
324.0000 mg | CHEWABLE_TABLET | Freq: Once | ORAL | Status: AC
  Administered 2013-07-12: 324 mg via ORAL

## 2013-07-12 MED ORDER — MORPHINE SULFATE 2 MG/ML IJ SOLN
INTRAMUSCULAR | Status: AC
  Filled 2013-07-12: qty 1

## 2013-07-12 MED ORDER — METOPROLOL TARTRATE 1 MG/ML IV SOLN: 2.0000 mg | INTRAVENOUS | Status: DC | PRN

## 2013-07-12 MED ORDER — GUAIFENESIN-DM 100-10 MG/5ML PO SYRP: 15.0000 mL | ORAL_SOLUTION | ORAL | Status: DC | PRN

## 2013-07-12 MED ORDER — ONDANSETRON HCL 4 MG/2ML IJ SOLN: 4.0000 mg | Freq: Four times a day (QID) | INTRAMUSCULAR | Status: DC | PRN

## 2013-07-12 MED ORDER — SODIUM CHLORIDE 0.9 % IV SOLN
INTRAVENOUS | Status: DC
  Administered 2013-07-12: 11:00:00 via INTRAVENOUS

## 2013-07-12 MED ORDER — VERAPAMIL HCL 2.5 MG/ML IV SOLN
INTRAVENOUS | Status: AC
  Filled 2013-07-12: qty 2

## 2013-07-12 MED ORDER — CLOPIDOGREL BISULFATE 300 MG PO TABS
300.0000 mg | ORAL_TABLET | Freq: Once | ORAL | Status: AC
  Administered 2013-07-12: 300 mg via ORAL

## 2013-07-12 MED ORDER — MIDAZOLAM HCL 2 MG/2ML IJ SOLN
INTRAMUSCULAR | Status: AC
  Filled 2013-07-12: qty 2

## 2013-07-12 MED ORDER — HEPARIN SODIUM (PORCINE) 1000 UNIT/ML IJ SOLN
INTRAMUSCULAR | Status: AC
  Filled 2013-07-12: qty 1

## 2013-07-12 MED ORDER — SODIUM CHLORIDE 0.9 % IV SOLN: 1.0000 mL/kg/h | INTRAVENOUS | Status: DC

## 2013-07-12 MED ORDER — OXYCODONE-ACETAMINOPHEN 5-325 MG PO TABS: 1.0000 | ORAL_TABLET | ORAL | Status: DC | PRN

## 2013-07-12 MED ORDER — MORPHINE SULFATE 10 MG/ML IJ SOLN: 2.0000 mg | INTRAMUSCULAR | Status: DC | PRN

## 2013-07-12 MED ORDER — LIDOCAINE HCL (PF) 1 % IJ SOLN
INTRAMUSCULAR | Status: AC
  Filled 2013-07-12: qty 30

## 2013-07-12 MED ORDER — CLOPIDOGREL BISULFATE 75 MG PO TABS
ORAL_TABLET | ORAL | Status: AC
  Filled 2013-07-12: qty 4

## 2013-07-12 MED ORDER — LABETALOL HCL 5 MG/ML IV SOLN: 10.0000 mg | INTRAVENOUS | Status: DC | PRN

## 2013-07-12 MED ORDER — FAMOTIDINE IN NACL 20-0.9 MG/50ML-% IV SOLN
INTRAVENOUS | Status: AC
  Filled 2013-07-12: qty 50

## 2013-07-12 MED ORDER — NITROGLYCERIN 0.2 MG/ML ON CALL CATH LAB
INTRAVENOUS | Status: AC
  Filled 2013-07-12: qty 1

## 2013-07-12 NOTE — Op Note (Signed)
[facility] [place]  Patient name: Brittanyann Wittner MRN: [id number] DOB: 10/19/1931 Sex: female  07/12/2013 Pre-operative Diagnosis: Bilateral lower extremity ulceration Post-operative diagnosis:  Same Surgeon:  Jorge Ny Procedure Performed:  1.  ultrasound-guided access, right femoral artery  2.  left lower extremity runoff  3.  atherectomy/angioplasty, left superficial femoral and popliteal artery  4.  intra-arterial administration of nitroglycerin    Indications:  The patient is previously undergone intervention to her right leg for ulcer disease. She comes back in for treatment of her left leg.  Procedure:  The patient was identified in the holding area and taken to room 8.  The patient was then placed supine on the table and prepped and draped in the usual sterile fashion.  A time out was called.  Ultrasound was used to evaluate the right common femoral artery.  It was patent .  A digital ultrasound image was acquired.  A micropuncture needle was used to access the right common femoral artery under ultrasound guidance.  An 018 wire was advanced without resistance and a micropuncture sheath was placed.  The 018 wire was removed and a benson wire was placed.  The micropuncture sheath was exchanged for a 5 french sheath.  An omniflush catheter and a Benson wire were used to cross the aortic bifurcation. A 7 French sheath was advanced into the left external iliac artery and left lower extremity runoff images were acquired Findings:    Left Lower Extremity:  Diffuse stenosis within the distal superficial femoral and above-knee popliteal artery with multiple areas of 60-70% stenosis. The below knee popliteal artery is widely patent and they're single vessel runoff via the peroneal artery.  Intervention:  After the above images were acquired, the decision was made to proceed with intervention. The patient was fully heparinized. Using a Glidewire and a 4 [place]  straight catheter I got the catheter beyond the disease within the below knee popliteal artery. A viper wire was then placed. I then used a CSI orbital atherectomy device, 1.5 classic crown to perform atherectomy at low medium and high speed for 30 seconds with each pass. I performed an additional passes at high speed. Prior to atherectomy, I administered 400 mcg of intra-arterial nitroglycerin with the catheter in the popliteal artery. I then proceeded with balloon angioplasty of the atherectomized area using a 5 mm Fox SV balloon. Completion arteriogram revealed significant improvement in blood flow through the treated region. There was a non-flow-limiting dissection at the distal extent of the treated area. I elected to reinsert the 5 mm balloon, take it to nominal pressure and held for 3 minutes. Completion study was performed which showed a slight improvement in the non-flow-limiting dissection. Further imaging of the runoff was obtained which showed no difference from pre intervention. Therefore, I elected to terminate the procedure. Catheters and wires were removed. The sheath was withdrawn into the right external iliac artery. The patient taken the holding area for sheath pull  Impression:  #1  successful atherectomy and angioplasty of the left distal superficial femoral and above-knee popliteal artery using a CSI 1.5 classic crown the device and a 5 mm balloon.  #2  single vessel runoff via the peroneal artery.    Juleen China, M.D. [facility] [place] Office: [phone] Pager:  [phone]

## 2013-07-12 NOTE — H&P (Signed)
[facility] [place]   Patient name: Alyssa Waters MRN: [id number] DOB: [birth year] Sex: female    No chief complaint on file.   HISTORY OF PRESENT ILLNESS: Patient has bilateral lower extremity ulcers. She previously underwent atherectomy and angioplasty of the right popliteal artery. She comes back today for treatment of the left leg.  Past Medical History  Diagnosis Date  . HYPERTENSION 08/09/2007  . GERD (gastroesophageal reflux disease)   . Rheumatoid arthritis(714.0)   . Endocarditis   . PE (pulmonary embolism)   . Hyperlipidemia   . Shortness of breath     exertion  . Depression   . Stroke     left sided weakness  . Dizziness     Past Surgical History  Procedure Laterality Date  . Abdominal hysterectomy    . Tubal ligation    . Tonsillectomy    . Varicose vein surgery  1971  . Rotator cuff repair  1986    right  . Rectocele repair  1998  . Peg tube placement  04/2002    removed as well  . Breast lumpectomy  2007    right  . Wrist surgery Left   . Fracture surgery      History   Social History  . Marital Status: Divorced    Spouse Name: N/A    Number of Children: N/A  . Years of Education: N/A   Occupational History  . Not on file.   Social History Main Topics  . Smoking status: Former Smoker    Quit date: 12/15/2010  . Smokeless tobacco: Never Used  . Alcohol Use: No  . Drug Use: No  . Sexually Active: Not on file   Other Topics Concern  . Not on file   Social History Narrative   Lives with som   Daughter helps out   Has  Helper  and come in also   4 children           Family History  Problem Relation Age of Onset  . Cancer Mother     pancreatic  . Heart attack Father   . Heart disease Father   . Hypertension Sister   . Stroke Brother     Allergies as of 07/12/2013 - Review Complete 07/12/2013  Allergen Reaction Noted  . Donepezil Nausea Only 06/02/2013    No current facility-administered medications on  file prior to encounter.   Current Outpatient Prescriptions on File Prior to Encounter  Medication Sig Dispense Refill  . amLODipine (NORVASC) 10 MG tablet Take 10 mg by mouth every morning.       [name] FLUoxetine (PROZAC) 20 MG capsule Take 20 mg by mouth every morning.       [name] HYDROcodone-acetaminophen (NORCO/VICODIN) 5-325 MG per tablet Take 1 tablet by mouth every 8 (eight) hours as needed for pain.       [name] leflunomide (ARAVA) 20 MG tablet Take 20 mg by mouth daily.      . clopidogrel (PLAVIX) 75 MG tablet Take 1 tablet (75 mg total) by mouth daily.  30 tablet  11     REVIEW OF SYSTEMS: No changes from prior visit PHYSICAL EXAMINATION:   Vital signs are BP 144/71  Pulse 89  Temp(Src) 98.2 F (36.8 C) (Oral)  Resp 16  Ht 5\' 2"  (1.575 m)  Wt 115 lb (52.164 kg)  BMI 21.03 kg/m2  SpO2 93% General: The patient appears their stated age. HEENT:  No gross abnormalities Pulmonary:  Non labored  breathing Musculoskeletal: There are no major deformities. Neurologic: No focal weakness or paresthesias are detected, Skin:  Bilateral ulcers. Psychiatric: The patient has normal affect. Cardiovascular: There is a regular rate and rhythm without significant murmur appreciated.    Assessment: Bilateral lower extremity ulcers Plan: Intervention to left lower extremity  V. Charlena Cross, M.D. [facility] [place] Office: [phone] Pager:  [phone]

## 2013-08-10 ENCOUNTER — Other Ambulatory Visit: Payer: Self-pay | Admitting: Family Medicine

## 2013-08-10 ENCOUNTER — Ambulatory Visit
Admission: RE | Admit: 2013-08-10 | Discharge: 2013-08-10 | Disposition: A | Discharge status: Home / Self Care | Payer: Medicare (Managed Care) | Source: Ambulatory Visit | Attending: Family Medicine | Admitting: Family Medicine

## 2013-08-10 DIAGNOSIS — R0781 Pleurodynia: Secondary | ICD-10-CM

## 2013-08-10 DIAGNOSIS — M25532 Pain in left wrist: Secondary | ICD-10-CM

## 2013-08-10 DIAGNOSIS — R410 Disorientation, unspecified: Secondary | ICD-10-CM

## 2013-08-10 MED ORDER — IOHEXOL 300 MG/ML  SOLN
75.0000 mL | Freq: Once | INTRAMUSCULAR | Status: AC | PRN
  Administered 2013-08-10: 75 mL via INTRAVENOUS

## 2013-09-16 ENCOUNTER — Ambulatory Visit
Admission: RE | Admit: 2013-09-16 | Discharge: 2013-09-16 | Disposition: A | Discharge status: Home / Self Care | Payer: No Typology Code available for payment source | Source: Ambulatory Visit | Attending: *Deleted | Admitting: *Deleted

## 2013-09-16 ENCOUNTER — Other Ambulatory Visit: Payer: Self-pay | Admitting: *Deleted

## 2013-09-16 DIAGNOSIS — R0781 Pleurodynia: Secondary | ICD-10-CM

## 2013-12-11 IMAGING — CR DG CHEST 2V
2 series · 2 of 2 positions shown · non-contrast
Comparison: September 03, 2011.

CLINICAL DATA: Hypertension

CHEST - 2 VIEW

[view not recorded (1 of 2)]
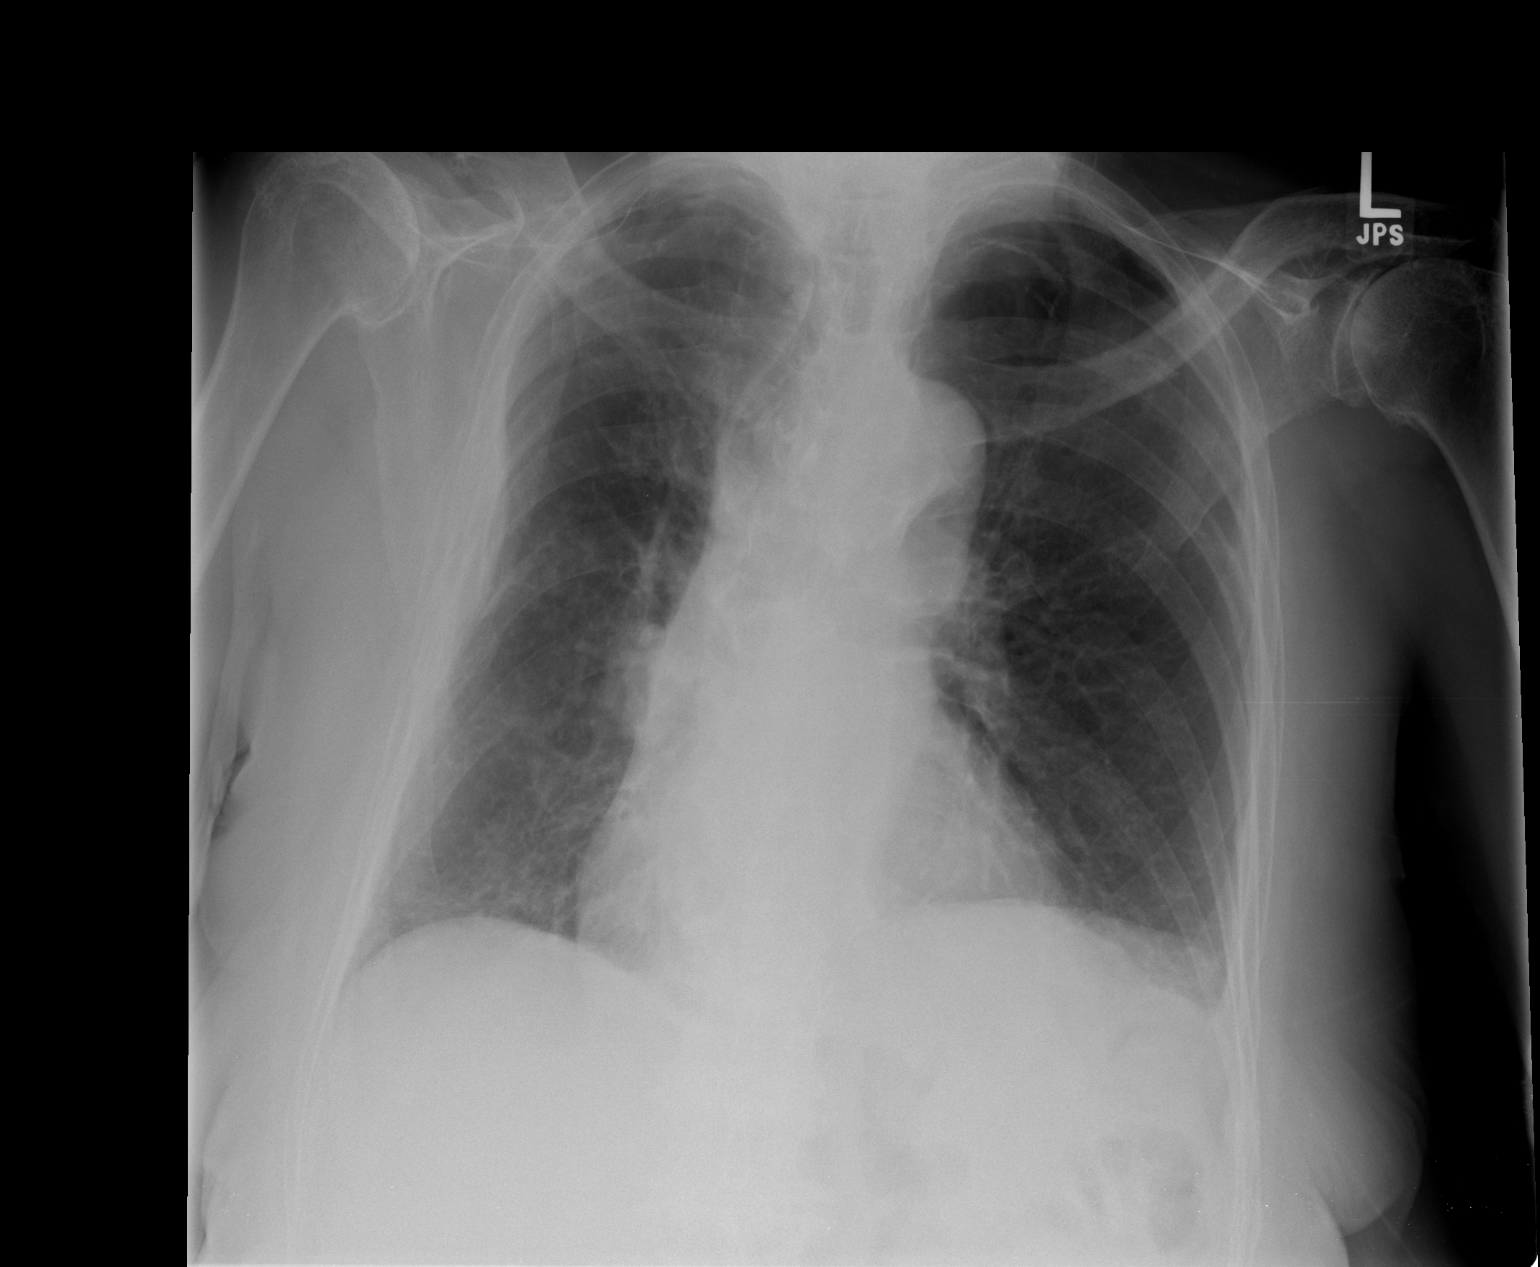

[view not recorded (2 of 2)]
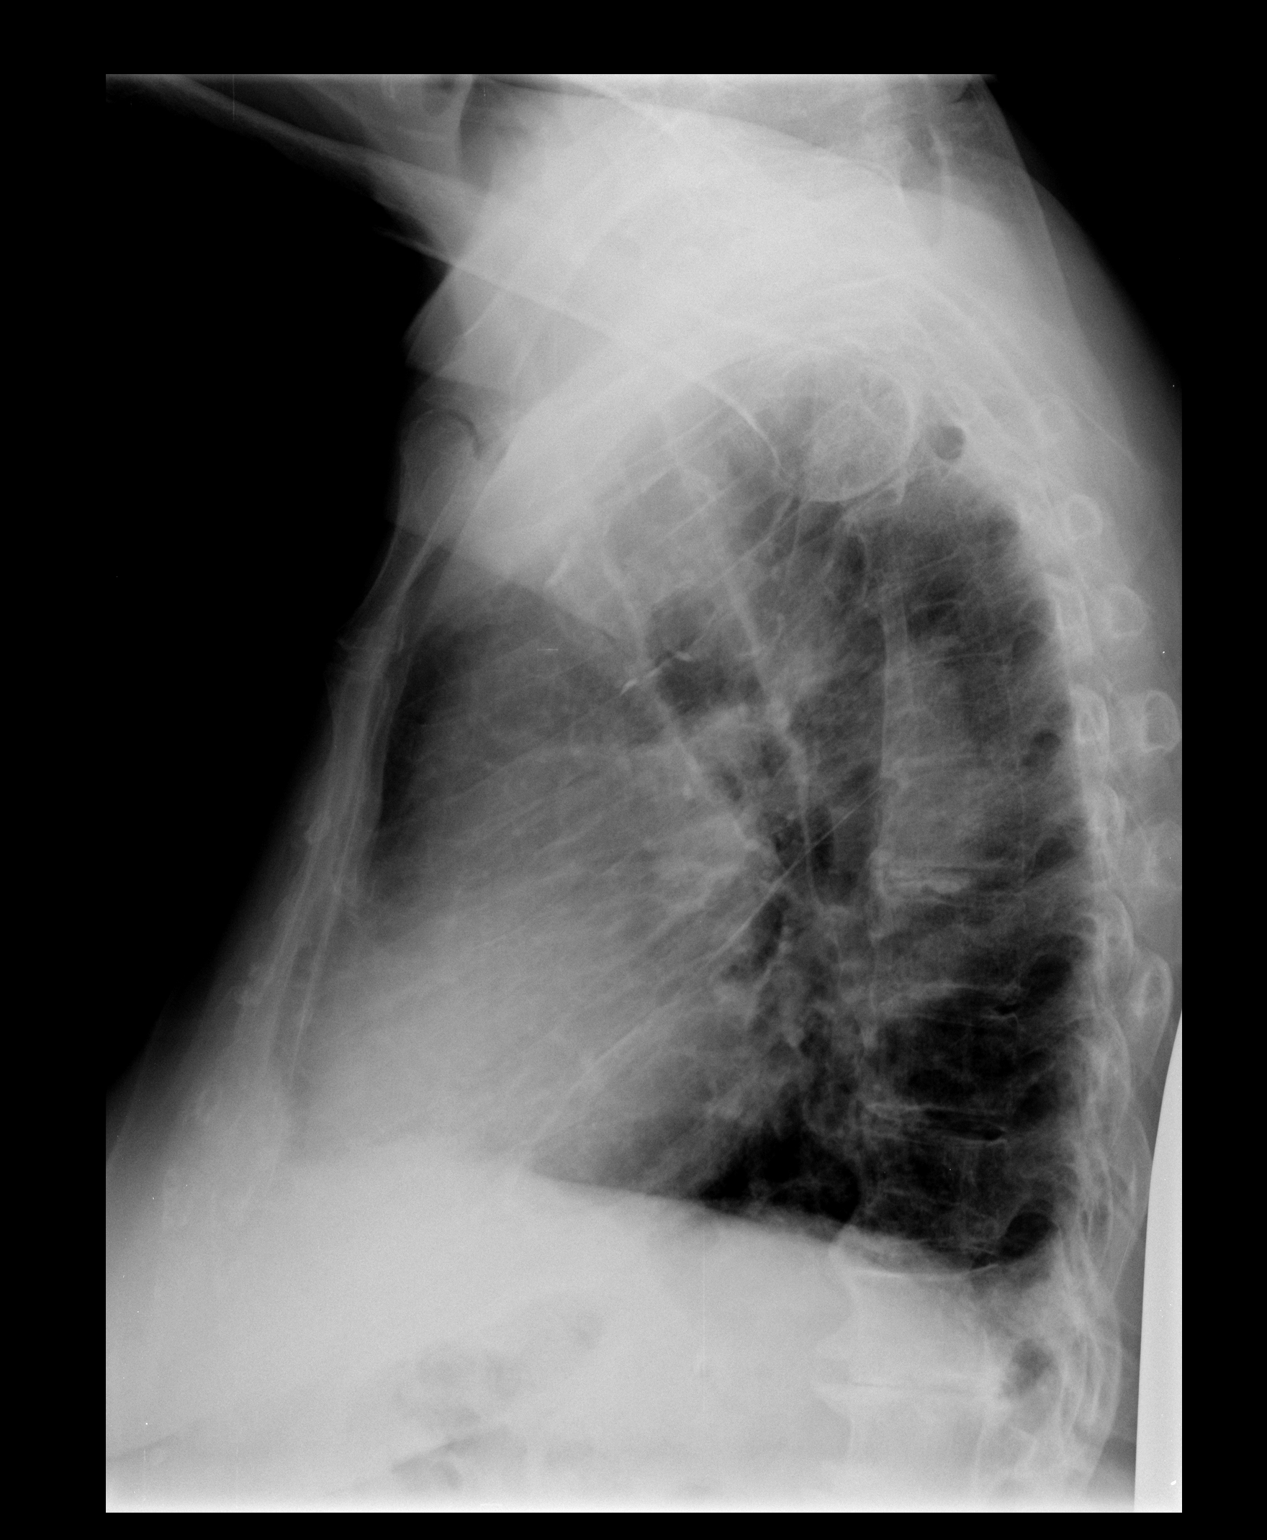

[2 of 2 positions shown; findings below may reference images not displayed]

FINDINGS: Cardiomediastinal silhouette appears normal.  No acute
pulmonary disease is noted.  Old right rib fractures are again
noted.  No pleural effusion is noted.
IMPRESSION: No acute cardiopulmonary abnormality seen.

## 2014-02-12 DEATH — deceased

## 2014-03-13 ENCOUNTER — Telehealth: Payer: Self-pay

## 2014-03-13 NOTE — Telephone Encounter (Signed)
Patient past away @ Home per [facility] in [place] °

## 2014-11-23 ENCOUNTER — Encounter (HOSPITAL_COMMUNITY): Payer: Self-pay | Admitting: Surgery
